# Patient Record
Sex: Female | Born: 1970 | Race: Black or African American | Hispanic: No | Marital: Single | Smoking: Former smoker
Health system: Southern US, Community
[De-identification: ages and names within clinical notes are randomized; demographics above are authoritative.]

## PROBLEM LIST (undated history)

## (undated) DIAGNOSIS — K279 Peptic ulcer, site unspecified, unspecified as acute or chronic, without hemorrhage or perforation: Secondary | ICD-10-CM

## (undated) DIAGNOSIS — J45909 Unspecified asthma, uncomplicated: Secondary | ICD-10-CM

## (undated) DIAGNOSIS — I10 Essential (primary) hypertension: Secondary | ICD-10-CM

## (undated) HISTORY — DX: Peptic ulcer, site unspecified, unspecified as acute or chronic, without hemorrhage or perforation: K27.9

---

## 2001-12-12 ENCOUNTER — Emergency Department (HOSPITAL_COMMUNITY): Admission: EM | Admit: 2001-12-12 | Discharge: 2001-12-12 | Payer: Self-pay | Admitting: *Deleted

## 2017-07-06 ENCOUNTER — Emergency Department (HOSPITAL_COMMUNITY)
Admission: EM | Admit: 2017-07-06 | Discharge: 2017-07-06 | Discharge status: Against Medical Advice | Payer: Self-pay | Attending: Emergency Medicine | Admitting: Emergency Medicine

## 2017-07-06 ENCOUNTER — Encounter (HOSPITAL_COMMUNITY): Payer: Self-pay

## 2017-07-06 DIAGNOSIS — R05 Cough: Secondary | ICD-10-CM | POA: Insufficient documentation

## 2017-07-06 DIAGNOSIS — Z5321 Procedure and treatment not carried out due to patient leaving prior to being seen by health care provider: Secondary | ICD-10-CM | POA: Insufficient documentation

## 2017-07-06 HISTORY — DX: Unspecified asthma, uncomplicated: J45.909

## 2017-07-06 LAB — RAPID STREP SCREEN (MED CTR MEBANE ONLY): Streptococcus, Group A Screen (Direct): NEGATIVE

## 2017-07-06 NOTE — ED Triage Notes (Signed)
Per Pt, Pt is coming from home with complaints of cough and congestion along with sore throat that started two days ago. Pt has hx of Asthma. Denies any chest pain.

## 2017-07-06 NOTE — ED Notes (Signed)
Patient stated that she had to leave in order to pick up her kids. EDP aware. Patient ambulatory with steady gait.

## 2017-07-06 NOTE — ED Provider Notes (Cosign Needed)
Patient was roomed but eloped prior to my evaluation. I did not evaluate the patient.   Dietrich PatesKhatri, Mahati Vajda, PA-C 07/06/17 1531

## 2017-07-08 LAB — CULTURE, GROUP A STREP (THRC)

## 2017-07-10 ENCOUNTER — Inpatient Hospital Stay (HOSPITAL_COMMUNITY)
Admission: EM | Admit: 2017-07-10 | Discharge: 2017-07-17 | DRG: 326 | Disposition: A | Discharge status: Home Health | Payer: BLUE CROSS/BLUE SHIELD | Attending: Surgery | Admitting: Surgery

## 2017-07-10 ENCOUNTER — Encounter (HOSPITAL_COMMUNITY): Payer: Self-pay | Admitting: Emergency Medicine

## 2017-07-10 ENCOUNTER — Encounter (HOSPITAL_COMMUNITY): Admission: EM | Disposition: A | Discharge status: Home Health | Payer: Self-pay | Source: Home / Self Care

## 2017-07-10 ENCOUNTER — Emergency Department (HOSPITAL_COMMUNITY): Payer: BLUE CROSS/BLUE SHIELD

## 2017-07-10 ENCOUNTER — Emergency Department (HOSPITAL_COMMUNITY): Payer: BLUE CROSS/BLUE SHIELD | Admitting: Anesthesiology

## 2017-07-10 ENCOUNTER — Other Ambulatory Visit: Payer: Self-pay

## 2017-07-10 DIAGNOSIS — T39395A Adverse effect of other nonsteroidal anti-inflammatory drugs [NSAID], initial encounter: Secondary | ICD-10-CM | POA: Diagnosis present

## 2017-07-10 DIAGNOSIS — R198 Other specified symptoms and signs involving the digestive system and abdomen: Secondary | ICD-10-CM

## 2017-07-10 DIAGNOSIS — J45909 Unspecified asthma, uncomplicated: Secondary | ICD-10-CM | POA: Diagnosis present

## 2017-07-10 DIAGNOSIS — K567 Ileus, unspecified: Secondary | ICD-10-CM

## 2017-07-10 DIAGNOSIS — K3184 Gastroparesis: Secondary | ICD-10-CM | POA: Diagnosis not present

## 2017-07-10 DIAGNOSIS — M549 Dorsalgia, unspecified: Secondary | ICD-10-CM | POA: Diagnosis present

## 2017-07-10 DIAGNOSIS — K659 Peritonitis, unspecified: Secondary | ICD-10-CM | POA: Diagnosis present

## 2017-07-10 DIAGNOSIS — K255 Chronic or unspecified gastric ulcer with perforation: Secondary | ICD-10-CM | POA: Diagnosis present

## 2017-07-10 DIAGNOSIS — F17210 Nicotine dependence, cigarettes, uncomplicated: Secondary | ICD-10-CM | POA: Diagnosis present

## 2017-07-10 DIAGNOSIS — K251 Acute gastric ulcer with perforation: Secondary | ICD-10-CM

## 2017-07-10 DIAGNOSIS — K9189 Other postprocedural complications and disorders of digestive system: Secondary | ICD-10-CM

## 2017-07-10 DIAGNOSIS — G8929 Other chronic pain: Secondary | ICD-10-CM | POA: Diagnosis present

## 2017-07-10 DIAGNOSIS — F1990 Other psychoactive substance use, unspecified, uncomplicated: Secondary | ICD-10-CM

## 2017-07-10 DIAGNOSIS — E876 Hypokalemia: Secondary | ICD-10-CM | POA: Diagnosis present

## 2017-07-10 HISTORY — PX: LAPAROTOMY: SHX154

## 2017-07-10 LAB — PREGNANCY, URINE: Preg Test, Ur: NEGATIVE

## 2017-07-10 LAB — I-STAT BETA HCG BLOOD, ED (MC, WL, AP ONLY): I-stat hCG, quantitative: 5.4 m[IU]/mL — ABNORMAL HIGH (ref <5)

## 2017-07-10 LAB — COMPREHENSIVE METABOLIC PANEL
ALBUMIN: 4.1 g/dL (ref 3.5–5.0)
ALT: 11 U/L — ABNORMAL LOW (ref 14–54)
ANION GAP: 10 (ref 5–15)
AST: 17 U/L (ref 15–41)
Alkaline Phosphatase: 56 U/L (ref 38–126)
BUN: 9 mg/dL (ref 6–20)
CO2: 19 mmol/L — AB (ref 22–32)
Calcium: 9.6 mg/dL (ref 8.9–10.3)
Chloride: 106 mmol/L (ref 101–111)
Creatinine, Ser: 0.72 mg/dL (ref 0.44–1.00)
GFR calc Af Amer: >60 mL/min (ref >60)
GFR calc non Af Amer: >60 mL/min (ref >60)
GLUCOSE: 107 mg/dL — AB (ref 65–99)
POTASSIUM: 3.1 mmol/L — AB (ref 3.5–5.1)
SODIUM: 135 mmol/L (ref 135–145)
Total Bilirubin: 0.4 mg/dL (ref 0.3–1.2)
Total Protein: 7.2 g/dL (ref 6.5–8.1)

## 2017-07-10 LAB — CBC WITH DIFFERENTIAL/PLATELET
Basophils Absolute: 0 10*3/uL (ref 0.0–0.1)
Basophils Relative: 0 %
EOS ABS: 0 10*3/uL (ref 0.0–0.7)
EOS PCT: 0 %
HCT: 44.7 % (ref 36.0–46.0)
HEMOGLOBIN: 14.9 g/dL (ref 12.0–15.0)
Lymphocytes Relative: 11 %
Lymphs Abs: 0.4 10*3/uL — ABNORMAL LOW (ref 0.7–4.0)
MCH: 27.6 pg (ref 26.0–34.0)
MCHC: 33.3 g/dL (ref 30.0–36.0)
MCV: 82.9 fL (ref 78.0–100.0)
MONO ABS: 0.3 10*3/uL (ref 0.1–1.0)
MONOS PCT: 6 %
Neutro Abs: 3.2 10*3/uL (ref 1.7–7.7)
Neutrophils Relative %: 83 %
PLATELETS: 256 10*3/uL (ref 150–400)
RBC: 5.39 MIL/uL — ABNORMAL HIGH (ref 3.87–5.11)
RDW: 16.8 % — AB (ref 11.5–15.5)
WBC: 3.9 10*3/uL — ABNORMAL LOW (ref 4.0–10.5)

## 2017-07-10 LAB — CBG MONITORING, ED: GLUCOSE-CAPILLARY: 99 mg/dL (ref 65–99)

## 2017-07-10 LAB — RAPID URINE DRUG SCREEN, HOSP PERFORMED
AMPHETAMINES: NOT DETECTED
BENZODIAZEPINES: NOT DETECTED
Barbiturates: POSITIVE — AB
Cocaine: NOT DETECTED
OPIATES: NOT DETECTED
TETRAHYDROCANNABINOL: POSITIVE — AB

## 2017-07-10 LAB — SALICYLATE LEVEL: Salicylate Lvl: <7 mg/dL (ref 2.8–30.0)

## 2017-07-10 LAB — ACETAMINOPHEN LEVEL: Acetaminophen (Tylenol), Serum: <10 ug/mL — ABNORMAL LOW (ref 10–30)

## 2017-07-10 LAB — ETHANOL

## 2017-07-10 SURGERY — LAPAROTOMY, EXPLORATORY
Anesthesia: General | Site: Abdomen

## 2017-07-10 MED ORDER — PROPOFOL 10 MG/ML IV BOLUS
INTRAVENOUS | Status: DC | PRN
  Administered 2017-07-10: 160 mg via INTRAVENOUS

## 2017-07-10 MED ORDER — DIPHENHYDRAMINE HCL 50 MG/ML IJ SOLN: 12.5000 mg | Freq: Four times a day (QID) | INTRAMUSCULAR | Status: DC | PRN

## 2017-07-10 MED ORDER — SUCCINYLCHOLINE CHLORIDE 200 MG/10ML IV SOSY
PREFILLED_SYRINGE | INTRAVENOUS | Status: AC
  Filled 2017-07-10: qty 10

## 2017-07-10 MED ORDER — SODIUM CHLORIDE 0.9% FLUSH: 9.0000 mL | INTRAVENOUS | Status: DC | PRN

## 2017-07-10 MED ORDER — ACETAMINOPHEN 650 MG RE SUPP: 650.0000 mg | Freq: Four times a day (QID) | RECTAL | Status: DC | PRN

## 2017-07-10 MED ORDER — ACETAMINOPHEN 325 MG PO TABS: 650.0000 mg | ORAL_TABLET | Freq: Four times a day (QID) | ORAL | Status: DC | PRN

## 2017-07-10 MED ORDER — DIPHENHYDRAMINE HCL 25 MG PO CAPS: 25.0000 mg | ORAL_CAPSULE | Freq: Four times a day (QID) | ORAL | Status: DC | PRN

## 2017-07-10 MED ORDER — PIPERACILLIN-TAZOBACTAM 3.375 G IVPB 30 MIN
3.3750 g | Freq: Once | INTRAVENOUS | Status: AC
  Administered 2017-07-10: 3.375 g via INTRAVENOUS
  Filled 2017-07-10: qty 50

## 2017-07-10 MED ORDER — ZOLPIDEM TARTRATE 5 MG PO TABS
5.0000 mg | ORAL_TABLET | Freq: Every evening | ORAL | Status: DC | PRN
Start: 2017-07-10 — End: 2017-07-17

## 2017-07-10 MED ORDER — MORPHINE SULFATE (PF) 4 MG/ML IV SOLN
4.0000 mg | Freq: Once | INTRAVENOUS | Status: AC
  Administered 2017-07-10: 4 mg via INTRAVENOUS
  Filled 2017-07-10: qty 1

## 2017-07-10 MED ORDER — DIPHENHYDRAMINE HCL 12.5 MG/5ML PO ELIX: 12.5000 mg | ORAL_SOLUTION | Freq: Four times a day (QID) | ORAL | Status: DC | PRN

## 2017-07-10 MED ORDER — SUGAMMADEX SODIUM 500 MG/5ML IV SOLN
INTRAVENOUS | Status: DC | PRN
  Administered 2017-07-10: 200 mg via INTRAVENOUS

## 2017-07-10 MED ORDER — ROCURONIUM BROMIDE 10 MG/ML (PF) SYRINGE
PREFILLED_SYRINGE | INTRAVENOUS | Status: DC | PRN
  Administered 2017-07-10: 30 mg via INTRAVENOUS
  Administered 2017-07-10: 10 mg via INTRAVENOUS

## 2017-07-10 MED ORDER — ENOXAPARIN SODIUM 40 MG/0.4ML ~~LOC~~ SOLN
40.0000 mg | SUBCUTANEOUS | Status: DC
  Administered 2017-07-11 – 2017-07-17 (×7): 40 mg via SUBCUTANEOUS
  Filled 2017-07-10 (×7): qty 0.4

## 2017-07-10 MED ORDER — POTASSIUM CHLORIDE IN NACL 20-0.9 MEQ/L-% IV SOLN
INTRAVENOUS | Status: DC
  Administered 2017-07-10 – 2017-07-11 (×2): via INTRAVENOUS
  Filled 2017-07-10 (×3): qty 1000

## 2017-07-10 MED ORDER — FENTANYL CITRATE (PF) 250 MCG/5ML IJ SOLN
INTRAMUSCULAR | Status: AC
  Filled 2017-07-10: qty 5

## 2017-07-10 MED ORDER — LIDOCAINE 2% (20 MG/ML) 5 ML SYRINGE
INTRAMUSCULAR | Status: AC
  Filled 2017-07-10: qty 5

## 2017-07-10 MED ORDER — ROCURONIUM BROMIDE 10 MG/ML (PF) SYRINGE
PREFILLED_SYRINGE | INTRAVENOUS | Status: AC
  Filled 2017-07-10: qty 5

## 2017-07-10 MED ORDER — OXYCODONE HCL 5 MG/5ML PO SOLN: 5.0000 mg | Freq: Once | ORAL | Status: DC | PRN

## 2017-07-10 MED ORDER — SODIUM CHLORIDE 0.9 % IV BOLUS (SEPSIS)
1000.0000 mL | Freq: Once | INTRAVENOUS | Status: AC
  Administered 2017-07-10: 1000 mL via INTRAVENOUS

## 2017-07-10 MED ORDER — MIDAZOLAM HCL 5 MG/5ML IJ SOLN
INTRAMUSCULAR | Status: DC | PRN
  Administered 2017-07-10: 2 mg via INTRAVENOUS

## 2017-07-10 MED ORDER — NALOXONE HCL 0.4 MG/ML IJ SOLN: 0.4000 mg | INTRAMUSCULAR | Status: DC | PRN

## 2017-07-10 MED ORDER — DEXAMETHASONE SODIUM PHOSPHATE 10 MG/ML IJ SOLN
INTRAMUSCULAR | Status: AC
  Filled 2017-07-10: qty 1

## 2017-07-10 MED ORDER — OXYCODONE HCL 5 MG PO TABS: 5.0000 mg | ORAL_TABLET | Freq: Once | ORAL | Status: DC | PRN

## 2017-07-10 MED ORDER — ONDANSETRON HCL 4 MG/2ML IJ SOLN
INTRAMUSCULAR | Status: DC | PRN
  Administered 2017-07-10: 4 mg via INTRAVENOUS

## 2017-07-10 MED ORDER — IOPAMIDOL (ISOVUE-300) INJECTION 61%
INTRAVENOUS | Status: AC
  Filled 2017-07-10: qty 100

## 2017-07-10 MED ORDER — PANTOPRAZOLE SODIUM 40 MG IV SOLR
40.0000 mg | Freq: Every day | INTRAVENOUS | Status: DC
  Administered 2017-07-10: 40 mg via INTRAVENOUS
  Filled 2017-07-10: qty 40

## 2017-07-10 MED ORDER — MIDAZOLAM HCL 2 MG/2ML IJ SOLN
INTRAMUSCULAR | Status: AC
  Filled 2017-07-10: qty 2

## 2017-07-10 MED ORDER — ONDANSETRON HCL 4 MG/2ML IJ SOLN
4.0000 mg | Freq: Four times a day (QID) | INTRAMUSCULAR | Status: DC | PRN
  Administered 2017-07-13: 4 mg via INTRAVENOUS
  Filled 2017-07-10: qty 2

## 2017-07-10 MED ORDER — PIPERACILLIN-TAZOBACTAM 3.375 G IVPB
3.3750 g | Freq: Three times a day (TID) | INTRAVENOUS | Status: AC
  Administered 2017-07-10 – 2017-07-15 (×15): 3.375 g via INTRAVENOUS
  Filled 2017-07-10 (×13): qty 50

## 2017-07-10 MED ORDER — HYDROMORPHONE HCL 1 MG/ML IJ SOLN: 0.2500 mg | INTRAMUSCULAR | Status: DC | PRN

## 2017-07-10 MED ORDER — LACTATED RINGERS IV SOLN
INTRAVENOUS | Status: DC | PRN
  Administered 2017-07-10 (×2): via INTRAVENOUS

## 2017-07-10 MED ORDER — HYDRALAZINE HCL 20 MG/ML IJ SOLN: 10.0000 mg | INTRAMUSCULAR | Status: DC | PRN

## 2017-07-10 MED ORDER — IOPAMIDOL (ISOVUE-300) INJECTION 61%
100.0000 mL | Freq: Once | INTRAVENOUS | Status: AC | PRN
  Administered 2017-07-10: 100 mL via INTRAVENOUS

## 2017-07-10 MED ORDER — MORPHINE SULFATE 2 MG/ML IV SOLN
INTRAVENOUS | Status: DC
  Administered 2017-07-10: 4.5 mg via INTRAVENOUS
  Administered 2017-07-10: 7.5 mg via INTRAVENOUS
  Administered 2017-07-10: 3 mg via INTRAVENOUS
  Administered 2017-07-10: 14:00:00 via INTRAVENOUS
  Administered 2017-07-11: 1.5 mg via INTRAVENOUS
  Administered 2017-07-11 (×2): 3 mg via INTRAVENOUS
  Administered 2017-07-11: 9 mg via INTRAVENOUS
  Administered 2017-07-11: 4.5 mg via INTRAVENOUS
  Administered 2017-07-11: 3 mg via INTRAVENOUS
  Administered 2017-07-12: 06:00:00 via INTRAVENOUS
  Administered 2017-07-12: 9 mg via INTRAVENOUS
  Administered 2017-07-12: 3 mg via INTRAVENOUS
  Administered 2017-07-12: 12 mg via INTRAVENOUS
  Administered 2017-07-12: 11.3 mg via INTRAVENOUS
  Filled 2017-07-10 (×2): qty 30

## 2017-07-10 MED ORDER — PROPOFOL 10 MG/ML IV BOLUS
INTRAVENOUS | Status: AC
  Filled 2017-07-10: qty 20

## 2017-07-10 MED ORDER — SUCCINYLCHOLINE CHLORIDE 200 MG/10ML IV SOSY
PREFILLED_SYRINGE | INTRAVENOUS | Status: DC | PRN
  Administered 2017-07-10: 120 mg via INTRAVENOUS

## 2017-07-10 MED ORDER — ONDANSETRON 4 MG PO TBDP: 4.0000 mg | ORAL_TABLET | Freq: Four times a day (QID) | ORAL | Status: DC | PRN

## 2017-07-10 MED ORDER — ONDANSETRON HCL 4 MG/2ML IJ SOLN
INTRAMUSCULAR | Status: AC
  Filled 2017-07-10: qty 2

## 2017-07-10 MED ORDER — DEXAMETHASONE SODIUM PHOSPHATE 10 MG/ML IJ SOLN
INTRAMUSCULAR | Status: DC | PRN
  Administered 2017-07-10: 10 mg via INTRAVENOUS

## 2017-07-10 MED ORDER — ONDANSETRON HCL 4 MG/2ML IJ SOLN
4.0000 mg | Freq: Four times a day (QID) | INTRAMUSCULAR | Status: DC | PRN
Start: 2017-07-10 — End: 2017-07-10

## 2017-07-10 MED ORDER — DIPHENHYDRAMINE HCL 50 MG/ML IJ SOLN: 25.0000 mg | Freq: Four times a day (QID) | INTRAMUSCULAR | Status: DC | PRN

## 2017-07-10 MED ORDER — ORAL CARE MOUTH RINSE
15.0000 mL | Freq: Two times a day (BID) | OROMUCOSAL | Status: DC
  Administered 2017-07-10 – 2017-07-16 (×11): 15 mL via OROMUCOSAL

## 2017-07-10 MED ORDER — FENTANYL CITRATE (PF) 100 MCG/2ML IJ SOLN
INTRAMUSCULAR | Status: DC | PRN
  Administered 2017-07-10: 50 ug via INTRAVENOUS
  Administered 2017-07-10: 100 ug via INTRAVENOUS
  Administered 2017-07-10 (×2): 50 ug via INTRAVENOUS

## 2017-07-10 MED ORDER — 0.9 % SODIUM CHLORIDE (POUR BTL) OPTIME
TOPICAL | Status: DC | PRN
  Administered 2017-07-10: 6000 mL

## 2017-07-10 SURGICAL SUPPLY — 35 items
APPLICATOR COTTON TIP 6IN STRL (MISCELLANEOUS) ×1 IMPLANT
BLADE EXTENDED COATED 6.5IN (ELECTRODE) IMPLANT
BLADE HEX COATED 2.75 (ELECTRODE) ×3 IMPLANT
COVER MAYO STAND STRL (DRAPES) IMPLANT
COVER SURGICAL LIGHT HANDLE (MISCELLANEOUS) ×3 IMPLANT
DRAPE LAPAROSCOPIC ABDOMINAL (DRAPES) ×3 IMPLANT
DRAPE UTILITY XL STRL (DRAPES) IMPLANT
DRAPE WARM FLUID 44X44 (DRAPE) IMPLANT
DRSG VAC ATS SM SENSATRAC (GAUZE/BANDAGES/DRESSINGS) ×2 IMPLANT
ELECT REM PT RETURN 15FT ADLT (MISCELLANEOUS) ×3 IMPLANT
GAUZE SPONGE 4X4 12PLY STRL (GAUZE/BANDAGES/DRESSINGS) ×1 IMPLANT
GLOVE BIO SURGEON STRL SZ7 (GLOVE) ×3 IMPLANT
GLOVE BIOGEL PI IND STRL 7.0 (GLOVE) ×1 IMPLANT
GLOVE BIOGEL PI IND STRL 7.5 (GLOVE) ×1 IMPLANT
GLOVE BIOGEL PI INDICATOR 7.0 (GLOVE) ×2
GLOVE BIOGEL PI INDICATOR 7.5 (GLOVE) ×2
GOWN STRL REUS W/TWL LRG LVL3 (GOWN DISPOSABLE) ×6 IMPLANT
GOWN STRL REUS W/TWL XL LVL3 (GOWN DISPOSABLE) ×3 IMPLANT
HANDLE SUCTION POOLE (INSTRUMENTS) IMPLANT
KIT BASIN OR (CUSTOM PROCEDURE TRAY) ×3 IMPLANT
NS IRRIG 1000ML POUR BTL (IV SOLUTION) IMPLANT
PACK GENERAL/GYN (CUSTOM PROCEDURE TRAY) ×3 IMPLANT
SPONGE LAP 18X18 X RAY DECT (DISPOSABLE) IMPLANT
STAPLER VISISTAT 35W (STAPLE) IMPLANT
SUCTION POOLE HANDLE (INSTRUMENTS)
SUT PDS AB 1 CTX 36 (SUTURE) IMPLANT
SUT SILK 2 0 SH CR/8 (SUTURE) IMPLANT
SUT SILK 3 0 (SUTURE)
SUT SILK 3 0 SH CR/8 (SUTURE) ×2 IMPLANT
SUT SILK 3-0 18XBRD TIE 12 (SUTURE) IMPLANT
TOWEL OR 17X26 10 PK STRL BLUE (TOWEL DISPOSABLE) ×4 IMPLANT
TRAY FOLEY W/METER SILVER 14FR (SET/KITS/TRAYS/PACK) ×2 IMPLANT
TRAY FOLEY W/METER SILVER 16FR (SET/KITS/TRAYS/PACK) IMPLANT
WATER STERILE IRR 1000ML POUR (IV SOLUTION) ×12 IMPLANT
YANKAUER SUCT BULB TIP NO VENT (SUCTIONS) IMPLANT

## 2017-07-10 NOTE — ED Notes (Signed)
Transported to radiology 

## 2017-07-10 NOTE — Progress Notes (Signed)
Pharmacy Antibiotic Note  Sherri Gilbert is a 47 y.o. female admitted on 07/10/2017 with peritonitis.  Pharmacy has been consulted for Zosyn dosing.  Plan:  Zosyn 3.375 g IV given once over 30 minutes, then every 8 hrs by 4-hr infusion  Pharmacy will sign off but will follow peripherally for cultures or renal adjustments  Height: 5\' 7"  (170.2 cm) Weight: 130 lb (59 kg) IBW/kg (Calculated) : 61.6  Temp (24hrs), Avg:98.8 F (37.1 C), Min:98.6 F (37 C), Max:99.3 F (37.4 C)  Recent Labs  Lab 07/10/17 0738  WBC 3.9*  CREATININE 0.72    Estimated Creatinine Clearance: 80.1 mL/min (by C-G formula based on SCr of 0.72 mg/dL).    No Known Allergies   Thank you for allowing pharmacy to be a part of this patient's care.  Bernadene Personrew Kamorah Nevils, PharmD, BCPS [phone] 07/10/2017, 3:46 PM

## 2017-07-10 NOTE — Progress Notes (Signed)
A consult was received from an ED physician for Zosyn per pharmacy dosing.  The patient's profile has been reviewed for ht/wt/allergies/indication/available labs.   A one time order has been placed for Zosyn 3.375g.    Further antibiotics/pharmacy consults should be ordered by admitting physician if indicated.                       Thank you, Loralee PacasErin Jaia Alonge, PharmD, BCPS 07/10/2017  10:17 AM

## 2017-07-10 NOTE — ED Provider Notes (Signed)
[facility]-EMERGENCY DEPT Provider Note  CSN: [id number] Arrival date & time: 07/10/17 4098  Chief Complaint(s) Abdominal Pain (RLQ )  HPI Sherri Gilbert is a 47 y.o. female who presents with acute onset of abdominal pain that began approximately 8-10 hours prior to arrival.  Pain is epigastric, right upper quadrant, and right lower quadrant.  Constant since onset.  Stabbing in nature.  Pain is severe.  Exacerbated with movement and palpation.  No alleviating factors.  She denies any fevers or chills.  Denies any nausea or vomiting.  Denies any chest pain or current shortness of breath.   Additionally patient reports that for the past several days she has not been feeling well.  She states that she has been having some made back pain.  She presented for this on January 29 but left prior to being seen.  She reports that after that she went to the pharmacy and got over-the-counter Motrin.  She is taken approximately 20 capsules of 200mg  ibuprofen in the last 36 hrs.  Denies any illicit drug use.  No daily alcohol consumption.  Denies any past medical history or prior surgeries.   The history is provided by the patient.        Past Medical History Past Medical History:  Diagnosis Date  . Asthma    There are no active problems to display for this patient.  Home Medication(s) Prior to Admission medications   Medication Sig Start Date End Date Taking? Authorizing Provider  ibuprofen (ADVIL,MOTRIN) 200 MG tablet Take 400 mg by mouth every 6 (six) hours as needed for mild pain.   Yes [provider]                                                                                                                                    Past Surgical History History reviewed. No pertinent surgical history. Family History No family history on file.  Social History Social History   Tobacco Use  . Smoking status: Current Every Day Smoker    Packs/day: 0.50   Types: Cigarettes  . Smokeless tobacco: Never Used  Substance Use Topics  . Alcohol use: No    Frequency: Never  . Drug use: No   Allergies Patient has no known allergies.  Review of Systems Review of Systems All other systems are reviewed and are negative for acute change except as noted in the HPI  Physical Exam Vital Signs  I have reviewed the triage vital signs BP (!) 145/92 (BP Location: Left Arm)   Pulse (!) 115   Temp 99.3 F (37.4 C) (Oral)   Resp 20   LMP 06/12/2017 Comment: negative urine pregnancy test result 07-10-17  SpO2 97%   Physical Exam  Constitutional: She is oriented to person, place, and time. She appears well-developed and well-nourished. No distress.  HENT:  Head: Normocephalic and atraumatic.  Nose: Nose normal.  Eyes: Conjunctivae and EOM  are normal. Pupils are equal, round, and reactive to light. Right eye exhibits no discharge. Left eye exhibits no discharge. No scleral icterus.  Neck: Normal range of motion. Neck supple.  Cardiovascular: Normal rate and regular rhythm. Exam reveals no gallop and no friction rub.  No murmur heard. Pulmonary/Chest: Effort normal and breath sounds normal. No stridor. No respiratory distress. She has no rales.  Abdominal: Soft. She exhibits no distension. There is tenderness in the right upper quadrant, right lower quadrant and epigastric area. There is rebound and positive Murphy's sign. There is no rigidity and no guarding.  Musculoskeletal: She exhibits no edema or tenderness.  Neurological: She is alert and oriented to person, place, and time.  Skin: Skin is warm and dry. No rash noted. She is not diaphoretic. No erythema.  Psychiatric: She has a normal mood and affect.  Vitals reviewed.   ED Results and Treatments Labs (all labs ordered are listed, but only abnormal results are displayed) Labs Reviewed  COMPREHENSIVE METABOLIC PANEL - Abnormal; Notable for the following components:      Result Value    Potassium 3.1 (*)    CO2 19 (*)    Glucose, Bld 107 (*)    ALT 11 (*)    All other components within normal limits  ACETAMINOPHEN LEVEL - Abnormal; Notable for the following components:   Acetaminophen (Tylenol), Serum <10 (*)    All other components within normal limits  RAPID URINE DRUG SCREEN, HOSP PERFORMED - Abnormal; Notable for the following components:   Tetrahydrocannabinol POSITIVE (*)    Barbiturates POSITIVE (*)    All other components within normal limits  CBC WITH DIFFERENTIAL/PLATELET - Abnormal; Notable for the following components:   WBC 3.9 (*)    RBC 5.39 (*)    RDW 16.8 (*)    Lymphs Abs 0.4 (*)    All other components within normal limits  I-STAT BETA HCG BLOOD, ED ([facility], [facility], [facility] ONLY) - Abnormal; Notable for the following components:   I-stat hCG, quantitative 5.4 (*)    All other components within normal limits  SALICYLATE LEVEL  ETHANOL  PREGNANCY, URINE  CBG MONITORING, ED                                                                                                                         EKG  EKG Interpretation  Date/Time:  Saturday July 10 2017 07:02:51 EST Ventricular Rate:  117 PR Interval:    QRS Duration: 83 QT Interval:  312 QTC Calculation: 436 R Axis:   96 Text Interpretation:  Sinus tachycardia Borderline right axis deviation Consider left ventricular hypertrophy Nonspecific T abnormalities, inferior leads Confirmed by Nicanor Alcon, April ([id number]) on 07/10/2017 7:14:47 AM Also confirmed by Nicanor Alcon, April ([id number]), editor Barbette Hair [phone])  on 07/10/2017 8:28:02 AM      Radiology Ct Abdomen Pelvis W Contrast  Result Date: 07/10/2017 CLINICAL DATA:  Right lower abdominal pain x1 day EXAM: CT ABDOMEN AND PELVIS WITH CONTRAST  TECHNIQUE: Multidetector CT imaging of the abdomen and pelvis was performed using the standard protocol following bolus administration of intravenous contrast. CONTRAST:  100mL ISOVUE-300 IOPAMIDOL (ISOVUE-300) INJECTION 61%  COMPARISON:  None. FINDINGS: Lower chest: Lung bases are clear. Hepatobiliary: 12 mm cyst in the central right liver. Gallbladder is unremarkable. No intrahepatic or extrahepatic ductal dilatation. Pancreas: Within normal limits. Spleen: Within normal limits. Adrenals/Urinary Tract: Adrenal glands are within normal limits. Kidneys are within normal limits.  No hydronephrosis. Bladder is within normal limits. Stomach/Bowel: Mild submucosal edema in the region of the distal gastric antrum and proximal duodenum, best visualized on sagittal imaging (sagittal images 61 and 53). Given the additional findings below, this appearance is worrisome for possible gastroduodenal ulcer. No evidence of bowel obstruction. Normal appendix (series 2/image 41). Vascular/Lymphatic: No evidence of abdominal aortic aneurysm. No suspicious abdominopelvic lymphadenopathy. Reproductive: Enlarged uterus with dominant 9.9 x 7.4 x 9.4 cm intramural posterior uterine body mass (sagittal image 56), likely reflecting a very large uterine fibroid. Left ovary is within normal limits (series 2/image 54). Dilated tubular structure posterior the right ovary (series 2/image 62), favoring hydrosalpinx. Given the posterior location in the pelvis, this would be difficult to assess sonographically. Other: Moderate abdominopelvic ascites. Moderate pneumoperitoneum, predominantly along the anterior abdomen. Musculoskeletal: Mild degenerative changes at L3-4. IMPRESSION: Moderate pneumoperitoneum with associated abdominopelvic ascites, indicating a perforated hollow viscus. Given mild submucosal edema in the region of the distal gastric antrum and proximal duodenum, this raises a perforated gastroduodenal ulcer as the most likely etiology. 9.9 cm posterior uterine body mass, favoring a large uterine fibroid. Additional ancillary findings as above. Critical Value/emergent results were called by telephone at the time of interpretation on 07/10/2017 at 10:03 am to  Dr. Drema PryPEDRO Adalind Weitz , who verbally acknowledged these results. Electronically Signed   By: Charline BillsSriyesh  Krishnan M.D.   On: 07/10/2017 10:06   Pertinent labs & imaging results that were available during my care of the patient were reviewed by me and considered in my medical decision making (see chart for details).  Medications Ordered in ED Medications  iopamidol (ISOVUE-300) 61 % injection (not administered)  sodium chloride 0.9 % bolus 1,000 mL (not administered)  piperacillin-tazobactam (ZOSYN) IVPB 3.375 g (not administered)  morphine 4 MG/ML injection 4 mg (4 mg Intravenous Given 07/10/17 0909)  iopamidol (ISOVUE-300) 61 % injection 100 mL (100 mLs Intravenous Contrast Given 07/10/17 [id number])                                                                                                                                    Procedures Procedures CRITICAL CARE Performed by: Amadeo GarnetPedro Eduardo Delan Ksiazek Total critical care time: 40 minutes Critical care time was exclusive of separately billable procedures and treating other patients. Critical care was necessary to treat or prevent imminent or life-threatening deterioration. Critical care was time spent personally by me on the following activities: development of treatment plan with patient  and/or surrogate as well as nursing, discussions with consultants, evaluation of patient's response to treatment, examination of patient, obtaining history from patient or surrogate, ordering and performing treatments and interventions, ordering and review of laboratory studies, ordering and review of radiographic studies, pulse oximetry and re-evaluation of patient's condition.   (including critical care time)  Medical Decision Making / ED Course I have reviewed the nursing notes for this encounter and the patient's prior records (if available in EHR or on provided paperwork).     1. abd pain Epigastric, right upper quadrant, right lower quadrant abdominal tenderness  with positive Murphy sign.  No prior abdominal surgeries.  Given her recent ingestion of large dose of NSAIDs, I have suspicion for peptic ulcer disease.  Other possibilities would be pancreatitis, acute cholecystitis.  Screening labs obtain and are grossly reassuring without leukocytosis, severe electrolyte derangements or renal insufficiency.  CT scan was obtained which revealed pneumoperitoneum likely from gastric or duodenal source consistent with a likely perforated peptic ulcer.  Patient started on empiric antibiotics.  Surgery was consulted who evaluated the patient in the emergency department and will take patient to the OR for ex lap.   2. NSAID overuse 100mg /kg. Unlikely to have adverse effect. Will monitor for the duration of her work up.   Final Clinical Impression(s) / ED Diagnoses Final diagnoses:  Perforated viscus  Excessive use of nonsteroidal anti-inflammatory drug (NSAID)      This chart was dictated using voice recognition software.  Despite best efforts to proofread,  errors can occur which can change the documentation meaning.   Nira Conn, MD 07/10/17 1032

## 2017-07-10 NOTE — Anesthesia Postprocedure Evaluation (Signed)
Anesthesia Post Note  Patient: Sherri Gilbert  Procedure(s) Performed: EXPLORATORY LAPAROTOMY REPAIR OF PERFORATED ULCER AT PYLORUS (N/A Abdomen)     Patient location during evaluation: PACU Anesthesia Type: General Level of consciousness: awake and alert Pain management: pain level controlled Vital Signs Assessment: post-procedure vital signs reviewed and stable Respiratory status: spontaneous breathing, nonlabored ventilation, respiratory function stable and patient connected to nasal cannula oxygen Cardiovascular status: blood pressure returned to baseline and stable Postop Assessment: no apparent nausea or vomiting Anesthetic complications: no    Last Vitals:  Vitals:   07/10/17 1445 07/10/17 1500  BP: 140/86 138/83  Pulse: (!) 102 98  Resp: (!) 23 (!) 28  Temp:  37.1 C  SpO2: 100% 100%    Last Pain:  Vitals:   07/10/17 1500  TempSrc:   PainSc: Asleep                 Maurizio Geno

## 2017-07-10 NOTE — ED Notes (Signed)
Pt refuses all blood work and IV stick. Willing to give urine, pt verbalizes all she wants is a chest xray and a antibiotic.

## 2017-07-10 NOTE — Op Note (Signed)
Pre-op diagnosis:  Perforated viscus secondary to heavy NSAID use Post-op diagnosis:  Perforated prepyloric ulcer Procedure performed: Exploratory laparotomy with Cheree DittoGraham patch of perforated prepyloric ulcer/placement of subcutaneous wound VAC (2 x 15 cm) Surgeon:Kaycee Mcgaugh K Haisley Arens Anesthesia: General endotracheal Indications: This is a 47 year old female who presents after heavy usage of ibuprofen over the last day and a half for back pain.  She experienced sudden onset of abdominal pain and was brought to the emergency department for evaluation.  She was found to have a large amount of free fluid in her abdomen as well as evidence of free air consistent with perforated viscus.  We were consulted and brought her directly to the operating room for exploratory laparotomy.  Description of procedure: The patient is brought to the operating room placed in supine position on the operating room table.  After an adequate level of general anesthesia was obtained a Foley catheter was placed under sterile technique.  The patient's abdomen was prepped with ChloraPrep and draped in sterile fashion.  A timeout was taken to ensure the proper patient and proper procedure.  We made a vertical midline incision between the xiphoid and the umbilicus.  Dissection was carried down to the linea alba with cautery.  We divided the linea alba vertically.  We entered the peritoneal cavity sharply.  We encountered a large amount of bile colored fluid.  We suctioned out all of the visible bile colored fluid.  We then placed a Balfour retractor for exposure.  We were able to easily identify an 8 mm diameter full-thickness perforation just proximal to the pylorus.  Bile was leaking out of this hole.  The edges of the ulcer are mildly thickened but the remainder of the stomach appears normal.  The omentum nearby also appeared to be normal in texture.  We repaired the ulcer by closing with full-thickness 3-0 silk sutures.  The tails of the  sutures were then used to hold a patch of omentum over this repair.  There is no sign of further leaking.  The nasogastric tube was palpated in a position just proximal to the ulcer.  We then irrigated the abdomen thoroughly with 6 L of warm saline.  The irrigation E fluent was clear.  We then closed the fascia with double-stranded #1 PDS suture.  We placed a small VAC sponge cut to fit the midline wound and sealed this with an occlusive drape.  This was placed to suction.  The patient was then extubated and brought to the recovery room in stable condition.  All sponge, instrument, and needle counts are correct.  Wilmon ArmsMatthew K. Corliss Skainssuei, MD, [facility]  General/ Trauma Surgery  07/10/2017 1:11 PM

## 2017-07-10 NOTE — ED Notes (Signed)
She is aware of need for impending surgery; and reasons why as discussed with Dr. Eudelia Bunchardama. Her surgeon is currently performing a case in our O.R. And will perform hers afterward (~ 1 hour). She is notifying her family with her cell phone. She remains in no distress.

## 2017-07-10 NOTE — H&P (Signed)
Sherri Gilbert is an 47 y.o. female.   Chief Complaint: severe abdominal pain HPI: This is a 47 year old female in good health who presents with a couple of days of "not feeling well".  Patient had some respiratory symptoms and poor appetite.  She also had some back pain.  Reportedly she took over 20 capsules of 200 mg ibuprofen within the last 24-36 hours before her back pain.  She also has not had much food to eat during this time.  She had acute onset of generalized abdominal pain beginning yesterday evening.  She was brought in by EMS and is found to have what appears to be a perforated viscus.  Past Medical History:  Diagnosis Date  . Asthma     History reviewed. No pertinent surgical history.  No family history on file. Social History:  reports that she has been smoking cigarettes.  She has been smoking about 0.50 packs per day. she has never used smokeless tobacco. She reports that she does not drink alcohol or use drugs.  Allergies: No Known Allergies  Prior to Admission medications   Medication Sig Start Date End Date Taking? Authorizing Provider  ibuprofen (ADVIL,MOTRIN) 200 MG tablet Take 400 mg by mouth every 6 (six) hours as needed for mild pain.   Yes [provider]     Results for orders placed or performed during the hospital encounter of 07/10/17 (from the past 48 hour(s))  Urine rapid drug screen (hosp performed)     Status: Abnormal   Collection Time: 07/10/17  5:43 AM  Result Value Ref Range   Opiates NONE DETECTED NONE DETECTED   Cocaine NONE DETECTED NONE DETECTED   Benzodiazepines NONE DETECTED NONE DETECTED   Amphetamines NONE DETECTED NONE DETECTED   Tetrahydrocannabinol POSITIVE (A) NONE DETECTED   Barbiturates POSITIVE (A) NONE DETECTED    Comment: (NOTE) DRUG SCREEN FOR MEDICAL PURPOSES ONLY.  IF CONFIRMATION IS NEEDED FOR ANY PURPOSE, NOTIFY LAB WITHIN 5 DAYS. LOWEST DETECTABLE LIMITS FOR URINE DRUG SCREEN Drug Class                      Cutoff (ng/mL) Amphetamine and metabolites    1000 Barbiturate and metabolites    200 Benzodiazepine                 161 Tricyclics and metabolites     300 Opiates and metabolites        300 Cocaine and metabolites        300 THC                            50 Performed at [facility], [place] [address] [place], [place] [address]   Pregnancy, urine     Status: None   Collection Time: 07/10/17  5:43 AM  Result Value Ref Range   Preg Test, Ur NEGATIVE NEGATIVE    Comment:        THE SENSITIVITY OF THIS METHODOLOGY IS >20 mIU/mL. Performed at [facility], [place] [address] [place], [place] [address]   CBG monitoring, ED     Status: None   Collection Time: 07/10/17  7:16 AM  Result Value Ref Range   Glucose-Capillary 99 65 - 99 mg/dL   Comment 1 Notify RN   Comprehensive metabolic panel     Status: Abnormal   Collection Time: 07/10/17  7:38 AM  Result Value Ref Range   Sodium  135 135 - 145 mmol/L   Potassium 3.1 (L) 3.5 - 5.1 mmol/L   Chloride 106 101 - 111 mmol/L   CO2 19 (L) 22 - 32 mmol/L   Glucose, Bld 107 (H) 65 - 99 mg/dL   BUN 9 6 - 20 mg/dL   Creatinine, Ser 0.72 0.44 - 1.00 mg/dL   Calcium 9.6 8.9 - 10.3 mg/dL   Total Protein 7.2 6.5 - 8.1 g/dL   Albumin 4.1 3.5 - 5.0 g/dL   AST 17 15 - 41 U/L   ALT 11 (L) 14 - 54 U/L   Alkaline Phosphatase 56 38 - 126 U/L   Total Bilirubin 0.4 0.3 - 1.2 mg/dL   GFR calc non Af Amer >60 >60 mL/min   GFR calc Af Amer >60 >60 mL/min    Comment: (NOTE) The eGFR has been calculated using the CKD EPI equation. This calculation has not been validated in all clinical situations. eGFR's persistently <60 mL/min signify possible Chronic Kidney Disease.    Anion gap 10 5 - 15    Comment: Performed at [facility], [place] [address], [place], [place] [address]  Salicylate level     Status: None   Collection Time: 07/10/17  7:38 AM  Result Value Ref Range   Salicylate Lvl <7.8 2.8 -  30.0 mg/dL    Comment: Performed at [facility], [place] [address] [place], [place] [address]  Acetaminophen level     Status: Abnormal   Collection Time: 07/10/17  7:38 AM  Result Value Ref Range   Acetaminophen (Tylenol), Serum <10 (L) 10 - 30 ug/mL    Comment:        THERAPEUTIC CONCENTRATIONS VARY SIGNIFICANTLY. A RANGE OF 10-30 ug/mL MAY BE AN EFFECTIVE CONCENTRATION FOR MANY PATIENTS. HOWEVER, SOME ARE BEST TREATED AT CONCENTRATIONS OUTSIDE THIS RANGE. ACETAMINOPHEN CONCENTRATIONS >150 ug/mL AT 4 HOURS AFTER INGESTION AND >50 ug/mL AT 12 HOURS AFTER INGESTION ARE OFTEN ASSOCIATED WITH TOXIC REACTIONS. Performed at [facility], [place] [address], [place], [place] [address]   Ethanol     Status: None   Collection Time: 07/10/17  7:38 AM  Result Value Ref Range   Alcohol, Ethyl (B) <10 <10 mg/dL    Comment:        LOWEST DETECTABLE LIMIT FOR SERUM ALCOHOL IS 10 mg/dL FOR MEDICAL PURPOSES ONLY Performed at [facility] [address], [place], [place] [address]   CBC WITH DIFFERENTIAL     Status: Abnormal   Collection Time: 07/10/17  7:38 AM  Result Value Ref Range   WBC 3.9 (L) 4.0 - 10.5 K/uL   RBC 5.39 (H) 3.87 - 5.11 MIL/uL   Hemoglobin 14.9 12.0 - 15.0 g/dL   HCT 44.7 36.0 - 46.0 %   MCV 82.9 78.0 - 100.0 fL   MCH 27.6 26.0 - 34.0 pg   MCHC 33.3 30.0 - 36.0 g/dL   RDW 16.8 (H) 11.5 - 15.5 %   Platelets 256 150 - 400 K/uL   Neutrophils Relative % 83 %   Neutro Abs 3.2 1.7 - 7.7 K/uL   Lymphocytes Relative 11 %   Lymphs Abs 0.4 (L) 0.7 - 4.0 K/uL   Monocytes Relative 6 %   Monocytes Absolute 0.3 0.1 - 1.0 K/uL   Eosinophils Relative 0 %   Eosinophils Absolute 0.0 0.0 - 0.7 K/uL   Basophils Relative 0 %   Basophils Absolute 0.0 0.0 - 0.1 K/uL    Comment:  Performed at [facility], [place] [address], [place], [place] [address]  I-Stat beta hCG blood, ED     Status: Abnormal   Collection  Time: 07/10/17  8:09 AM  Result Value Ref Range   I-stat hCG, quantitative 5.4 (H) <5 mIU/mL   Comment 3            Comment:   GEST. AGE      CONC.  (mIU/mL)   <=1 WEEK        5 - 50     2 WEEKS       50 - 500     3 WEEKS       100 - 10,000     4 WEEKS     1,000 - 30,000        FEMALE AND NON-PREGNANT FEMALE:     LESS THAN 5 mIU/mL    Ct Abdomen Pelvis W Contrast  Result Date: 07/10/2017 CLINICAL DATA:  Right lower abdominal pain x1 day EXAM: CT ABDOMEN AND PELVIS WITH CONTRAST TECHNIQUE: Multidetector CT imaging of the abdomen and pelvis was performed using the standard protocol following bolus administration of intravenous contrast. CONTRAST:  144m ISOVUE-300 IOPAMIDOL (ISOVUE-300) INJECTION 61% COMPARISON:  None. FINDINGS: Lower chest: Lung bases are clear. Hepatobiliary: 12 mm cyst in the central right liver. Gallbladder is unremarkable. No intrahepatic or extrahepatic ductal dilatation. Pancreas: Within normal limits. Spleen: Within normal limits. Adrenals/Urinary Tract: Adrenal glands are within normal limits. Kidneys are within normal limits.  No hydronephrosis. Bladder is within normal limits. Stomach/Bowel: Mild submucosal edema in the region of the distal gastric antrum and proximal duodenum, best visualized on sagittal imaging (sagittal images 61 and 53). Given the additional findings below, this appearance is worrisome for possible gastroduodenal ulcer. No evidence of bowel obstruction. Normal appendix (series 2/image 41). Vascular/Lymphatic: No evidence of abdominal aortic aneurysm. No suspicious abdominopelvic lymphadenopathy. Reproductive: Enlarged uterus with dominant 9.9 x 7.4 x 9.4 cm intramural posterior uterine body mass (sagittal image 56), likely reflecting a very large uterine fibroid. Left ovary is within normal limits (series 2/image 54). Dilated tubular structure posterior the right ovary (series 2/image 62), favoring hydrosalpinx. Given the posterior location in the pelvis,  this would be difficult to assess sonographically. Other: Moderate abdominopelvic ascites. Moderate pneumoperitoneum, predominantly along the anterior abdomen. Musculoskeletal: Mild degenerative changes at L3-4. IMPRESSION: Moderate pneumoperitoneum with associated abdominopelvic ascites, indicating a perforated hollow viscus. Given mild submucosal edema in the region of the distal gastric antrum and proximal duodenum, this raises a perforated gastroduodenal ulcer as the most likely etiology. 9.9 cm posterior uterine body mass, favoring a large uterine fibroid. Additional ancillary findings as above. Critical Value/emergent results were called by telephone at the time of interpretation on 07/10/2017 at 10:03 am to Dr. PAddison Lank, who verbally acknowledged these results. Electronically Signed   By: SJulian HyM.D.   On: 07/10/2017 10:06    Review of Systems  Constitutional: Negative for weight loss.  HENT: Negative for ear discharge, ear pain, hearing loss and tinnitus.   Eyes: Negative for blurred vision, double vision, photophobia and pain.  Respiratory: Negative for cough, sputum production and shortness of breath.   Cardiovascular: Negative for chest pain.  Gastrointestinal: Positive for abdominal pain. Negative for nausea and vomiting.  Genitourinary: Negative for dysuria, flank pain, frequency and urgency.  Musculoskeletal: Negative for back pain, falls, joint pain, myalgias and neck pain.  Neurological: Negative for dizziness, tingling, sensory change, focal weakness, loss of consciousness  and headaches.  Endo/Heme/Allergies: Does not bruise/bleed easily.  Psychiatric/Behavioral: Negative for depression, memory loss and substance abuse. The patient is not nervous/anxious.     Blood pressure (!) 145/92, pulse (!) 115, temperature 99.3 F (37.4 C), temperature source Oral, resp. rate 20, last menstrual period 06/12/2017, SpO2 97 %. Physical Exam  WDWN in mild distress Eyes:  Pupils  equal, round; sclera anicteric HENT:  Oral mucosa moist; good dentition  Neck:  No masses palpated, no thyromegaly Lungs:  CTA bilaterally; normal respiratory effort CV:  Regular rate and rhythm; no murmurs; extremities well-perfused with no edema Abd:  +bowel sounds, distended; very tender to palpation with rebound and guarding Skin:  Warm, dry; no sign of jaundice Psychiatric - alert and oriented x 4; calm mood and affect  Assessment/Plan Perforated viscus - likely perforated gastric or duodenal ulcer secondary to heavy NSAID use.  TO OR for exploratory laparotomy, repair of ulcer (Graham patch).  The surgical procedure has been discussed with the patient.  Potential risks, benefits, alternative treatments, and expected outcomes have been explained.  All of the patient's questions at this time have been answered.  The likelihood of reaching the patient's treatment goal is good.  The patient understand the proposed surgical procedure and wishes to proceed.   Maia Petties, MD 07/10/2017, 10:17 AM

## 2017-07-10 NOTE — ED Notes (Signed)
Bed: [id number] Expected date:  Expected time:  Means of arrival:  Comments: EMS 47 yo female abdominal and flank pain

## 2017-07-10 NOTE — ED Triage Notes (Signed)
Pt comes to ed via, ems, from home right lower abdominal pain x 1 day. Pt took a estimated 20 ibuprofen in last 24 hrs. Pt is alert x4, v/s on arrival 164/94, hr110, rr14, SpO2 99 room air.  [facility] for [facility] issues, which why ttok some many ibuprofen.

## 2017-07-10 NOTE — ED Notes (Signed)
Called PC, recommendations of electrolyte panel, fluids and observation

## 2017-07-10 NOTE — Anesthesia Procedure Notes (Signed)
Procedure Name: Intubation Date/Time: 07/10/2017 12:10 PM Performed by: Stephenie Navejas D, CRNA Pre-anesthesia Checklist: Patient identified, Emergency Drugs available, Suction available and Patient being monitored Patient Re-evaluated:Patient Re-evaluated prior to induction Oxygen Delivery Method: Circle system utilized Preoxygenation: Pre-oxygenation with 100% oxygen Induction Type: IV induction, Rapid sequence and Cricoid Pressure applied Laryngoscope Size: Mac and 3 Grade View: Grade I Tube type: Oral Number of attempts: 1 Airway Equipment and Method: Stylet Placement Confirmation: ETT inserted through vocal cords under direct vision,  positive ETCO2 and breath sounds checked- equal and bilateral Secured at: 21 cm Tube secured with: Tape Dental Injury: Teeth and Oropharynx as per pre-operative assessment

## 2017-07-10 NOTE — Transfer of Care (Signed)
Immediate Anesthesia Transfer of Care Note  Patient: Sherri Gilbert  Procedure(s) Performed: EXPLORATORY LAPAROTOMY REPAIR OF PERFORATED ULCER AT PYLORUS (N/A Abdomen)  Patient Location: PACU  Anesthesia Type:General  Level of Consciousness: awake, alert  and oriented  Airway & Oxygen Therapy: Patient Spontanous Breathing and Patient connected to face mask oxygen  Post-op Assessment: Report given to RN and Post -op Vital signs reviewed and stable  Post vital signs: Reviewed and stable  Last Vitals:  Vitals:   07/10/17 0519 07/10/17 0706  BP:  (!) 145/92  Pulse:  (!) 115  Resp:  20  Temp:  37.4 C  SpO2: 100% 97%    Last Pain:  Vitals:   07/10/17 0706  TempSrc: Oral  PainSc:          Complications: No apparent anesthesia complications

## 2017-07-10 NOTE — ED Notes (Signed)
We take her to O.R. At this time, where she is received by Sue LushAndrea, RN. Her clothing (only) is placed into pt. Belonging cabinet directly across from E.D. 18. Valuables are taken to security and locked; and the key is kept with pt. Chart. With pt.

## 2017-07-10 NOTE — Anesthesia Preprocedure Evaluation (Signed)
Anesthesia Evaluation  Patient identified by MRN, date of birth, ID band Patient awake    Reviewed: Allergy & Precautions, NPO status , Patient's Chart, lab work & pertinent test results  History of Anesthesia Complications Negative for: history of anesthetic complications  Airway Mallampati: I  TM Distance: >3 FB Neck ROM: Full    Dental  (+) Teeth Intact,    Pulmonary asthma , Current Smoker,    breath sounds clear to auscultation       Cardiovascular negative cardio ROS   Rhythm:Regular     Neuro/Psych negative neurological ROS  negative psych ROS   GI/Hepatic Neg liver ROS, perforated ulcer    Endo/Other  negative endocrine ROS  Renal/GU negative Renal ROS     Musculoskeletal negative musculoskeletal ROS (+)   Abdominal   Peds  Hematology   Anesthesia Other Findings   Reproductive/Obstetrics                             Anesthesia Physical Anesthesia Plan  ASA: II and emergent  Anesthesia Plan: General   Post-op Pain Management:    Induction: Intravenous, Rapid sequence and Cricoid pressure planned  PONV Risk Score and Plan: 2 and Ondansetron and Dexamethasone  Airway Management Planned: Oral ETT  Additional Equipment: None  Intra-op Plan:   Post-operative Plan: Extubation in OR  Informed Consent: I have reviewed the patients History and Physical, chart, labs and discussed the procedure including the risks, benefits and alternatives for the proposed anesthesia with the patient or authorized representative who has indicated his/her understanding and acceptance.   Dental advisory given  Plan Discussed with: CRNA and Surgeon  Anesthesia Plan Comments:         Anesthesia Quick Evaluation

## 2017-07-11 ENCOUNTER — Encounter (HOSPITAL_COMMUNITY): Payer: Self-pay | Admitting: Surgery

## 2017-07-11 LAB — BASIC METABOLIC PANEL
ANION GAP: 10 (ref 5–15)
BUN: 15 mg/dL (ref 6–20)
CHLORIDE: 109 mmol/L (ref 101–111)
CO2: 24 mmol/L (ref 22–32)
Calcium: 8.7 mg/dL — ABNORMAL LOW (ref 8.9–10.3)
Creatinine, Ser: 0.77 mg/dL (ref 0.44–1.00)
GFR calc non Af Amer: >60 mL/min (ref >60)
GLUCOSE: 109 mg/dL — AB (ref 65–99)
POTASSIUM: 3.7 mmol/L (ref 3.5–5.1)
Sodium: 143 mmol/L (ref 135–145)

## 2017-07-11 LAB — CBC
HCT: 39 % (ref 36.0–46.0)
Hemoglobin: 12.8 g/dL (ref 12.0–15.0)
MCH: 26.8 pg (ref 26.0–34.0)
MCHC: 32.8 g/dL (ref 30.0–36.0)
MCV: 81.6 fL (ref 78.0–100.0)
Platelets: 271 10*3/uL (ref 150–400)
RBC: 4.78 MIL/uL (ref 3.87–5.11)
RDW: 17.3 % — AB (ref 11.5–15.5)
WBC: 23.3 10*3/uL — ABNORMAL HIGH (ref 4.0–10.5)

## 2017-07-11 MED ORDER — MENTHOL 3 MG MT LOZG
1.0000 | LOZENGE | OROMUCOSAL | Status: DC | PRN
  Administered 2017-07-11: 3 mg via ORAL
  Filled 2017-07-11: qty 9

## 2017-07-11 MED ORDER — PANTOPRAZOLE SODIUM 40 MG IV SOLR
40.0000 mg | Freq: Two times a day (BID) | INTRAVENOUS | Status: DC
  Administered 2017-07-11 – 2017-07-16 (×11): 40 mg via INTRAVENOUS
  Filled 2017-07-11 (×13): qty 40

## 2017-07-11 MED ORDER — PHENOL 1.4 % MT LIQD
1.0000 | OROMUCOSAL | Status: DC | PRN
  Administered 2017-07-11: 1 via OROMUCOSAL
  Filled 2017-07-11: qty 177

## 2017-07-11 NOTE — Progress Notes (Signed)
1 Day Post-Op  Subjective: Stable and alert Pain control reasonable with PCA Has ambulated in hall NG drainage bilious`  WBC 23,000.  Hemoglobin 12.8.  Potassium 3.7.  Creatinine 0.77  Objective: Vital signs in last 24 hours: Temp:  [97.8 F (36.6 C)-99.6 F (37.6 C)] 97.8 F (36.6 C) (02/03 0913) Pulse Rate:  [92-108] 103 (02/03 0913) Resp:  [16-29] 18 (02/03 0913) BP: (120-154)/(71-92) 128/75 (02/03 0913) SpO2:  [96 %-100 %] 100 % (02/03 0913) Weight:  [59 kg (130 lb)] 59 kg (130 lb) (02/02 1532) Last BM Date: 07/08/17  Intake/Output from previous day: 02/02 0701 - 02/03 0700 In: 2801.7 [I.V.:2751.7; IV Piggyback:50] Out: 2160 [Urine:1360; Emesis/NG output:800] Intake/Output this shift: Total I/O In: 480 [P.O.:30; I.V.:400; IV Piggyback:50] Out: -   General appearance: Alert and cooperative.  Mild distress from incisional pain. Resp: clear to auscultation bilaterally GI: Soft.  Nondistended.  Appropriately tender.  Silent.  Negative pressure dressing on wound.  Lab Results:  Results for orders placed or performed during the hospital encounter of 07/10/17 (from the past 24 hour(s))  CBC     Status: Abnormal   Collection Time: 07/11/17  5:24 AM  Result Value Ref Range   WBC 23.3 (H) 4.0 - 10.5 K/uL   RBC 4.78 3.87 - 5.11 MIL/uL   Hemoglobin 12.8 12.0 - 15.0 g/dL   HCT 16.139.0 09.636.0 - 04.546.0 %   MCV 81.6 78.0 - 100.0 fL   MCH 26.8 26.0 - 34.0 pg   MCHC 32.8 30.0 - 36.0 g/dL   RDW 40.917.3 (H) 81.111.5 - 91.415.5 %   Platelets 271 150 - 400 K/uL  Basic metabolic panel     Status: Abnormal   Collection Time: 07/11/17  5:24 AM  Result Value Ref Range   Sodium 143 135 - 145 mmol/L   Potassium 3.7 3.5 - 5.1 mmol/L   Chloride 109 101 - 111 mmol/L   CO2 24 22 - 32 mmol/L   Glucose, Bld 109 (H) 65 - 99 mg/dL   BUN 15 6 - 20 mg/dL   Creatinine, Ser 7.820.77 0.44 - 1.00 mg/dL   Calcium 8.7 (L) 8.9 - 10.3 mg/dL   GFR calc non Af Amer >60 >60 mL/min   GFR calc Af Amer >60 >60 mL/min   Anion gap 10 5 - 15     Studies/Results: No results found.  . enoxaparin (LOVENOX) injection  40 mg Subcutaneous Q24H  . mouth rinse  15 mL Mouth Rinse BID  . morphine   Intravenous Q4H  . pantoprazole (PROTONIX) IV  40 mg Intravenous QHS     Assessment/Plan: s/p Procedure(s): EXPLORATORY LAPAROTOMY REPAIR OF PERFORATED ULCER AT PYLORUS  POD #1.  -  Gram patch closure of perforated prepyloric ulcer and application of negative pressure dressing  -NSAIDS implicatred - Increase Protonix to twice a day  -Zosyn -lovenox -Consider Gastrografin upper GI study tomorrow or Tuesday   Tobacco abuse -Push incentive spirometry @PROBHOSP @  LOS: 1 day    Sherri Gilbert M Kia Varnadore 07/11/2017  . .prob

## 2017-07-12 ENCOUNTER — Inpatient Hospital Stay (HOSPITAL_COMMUNITY): Payer: BLUE CROSS/BLUE SHIELD

## 2017-07-12 LAB — BASIC METABOLIC PANEL
Anion gap: 8 (ref 5–15)
BUN: 15 mg/dL (ref 6–20)
CHLORIDE: 110 mmol/L (ref 101–111)
CO2: 24 mmol/L (ref 22–32)
Calcium: 8.6 mg/dL — ABNORMAL LOW (ref 8.9–10.3)
Creatinine, Ser: 0.6 mg/dL (ref 0.44–1.00)
GFR calc non Af Amer: >60 mL/min (ref >60)
Glucose, Bld: 72 mg/dL (ref 65–99)
POTASSIUM: 3.2 mmol/L — AB (ref 3.5–5.1)
SODIUM: 142 mmol/L (ref 135–145)

## 2017-07-12 LAB — CBC
HCT: 33.3 % — ABNORMAL LOW (ref 36.0–46.0)
HEMOGLOBIN: 10.9 g/dL — AB (ref 12.0–15.0)
MCH: 27.3 pg (ref 26.0–34.0)
MCHC: 32.7 g/dL (ref 30.0–36.0)
MCV: 83.3 fL (ref 78.0–100.0)
Platelets: 237 10*3/uL (ref 150–400)
RBC: 4 MIL/uL (ref 3.87–5.11)
RDW: 17.4 % — ABNORMAL HIGH (ref 11.5–15.5)
WBC: 19.2 10*3/uL — ABNORMAL HIGH (ref 4.0–10.5)

## 2017-07-12 LAB — MAGNESIUM: MAGNESIUM: 2.1 mg/dL (ref 1.7–2.4)

## 2017-07-12 MED ORDER — POTASSIUM CHLORIDE 10 MEQ/100ML IV SOLN
10.0000 meq | INTRAVENOUS | Status: AC
Start: 2017-07-12 — End: 2017-07-13
  Administered 2017-07-12 – 2017-07-13 (×3): 10 meq via INTRAVENOUS

## 2017-07-12 MED ORDER — MORPHINE SULFATE (PF) 2 MG/ML IV SOLN
2.0000 mg | INTRAVENOUS | Status: DC | PRN
  Administered 2017-07-12 – 2017-07-13 (×2): 4 mg via INTRAVENOUS
  Filled 2017-07-12 (×2): qty 2

## 2017-07-12 MED ORDER — POTASSIUM CHLORIDE 10 MEQ/100ML IV SOLN
10.0000 meq | INTRAVENOUS | Status: AC
  Administered 2017-07-12: 10 meq via INTRAVENOUS
  Filled 2017-07-12 (×4): qty 100

## 2017-07-12 MED ORDER — ACETAMINOPHEN 650 MG RE SUPP: 650.0000 mg | Freq: Four times a day (QID) | RECTAL | Status: DC | PRN

## 2017-07-12 MED ORDER — DEXTROSE 5 % IV SOLN
1000.0000 mg | Freq: Four times a day (QID) | INTRAVENOUS | Status: DC | PRN
  Filled 2017-07-12: qty 10

## 2017-07-12 MED ORDER — BISACODYL 10 MG RE SUPP
10.0000 mg | Freq: Every day | RECTAL | Status: DC
  Administered 2017-07-12: 10 mg via RECTAL
  Filled 2017-07-12 (×5): qty 1

## 2017-07-12 MED ORDER — ACETAMINOPHEN 10 MG/ML IV SOLN
1000.0000 mg | Freq: Four times a day (QID) | INTRAVENOUS | Status: AC
  Administered 2017-07-12 – 2017-07-13 (×4): 1000 mg via INTRAVENOUS
  Filled 2017-07-12 (×4): qty 100

## 2017-07-12 MED ORDER — POTASSIUM CHLORIDE IN NACL 40-0.9 MEQ/L-% IV SOLN
INTRAVENOUS | Status: DC
  Administered 2017-07-12 (×2): 50 mL/h via INTRAVENOUS
  Filled 2017-07-12 (×2): qty 1000

## 2017-07-12 MED ORDER — METHOCARBAMOL 1000 MG/10ML IJ SOLN
1000.0000 mg | Freq: Three times a day (TID) | INTRAVENOUS | Status: DC
  Administered 2017-07-12 – 2017-07-17 (×15): 1000 mg via INTRAVENOUS
  Filled 2017-07-12 (×19): qty 10

## 2017-07-12 MED ORDER — IOPAMIDOL (ISOVUE-300) INJECTION 61%
INTRAVENOUS | Status: AC
  Filled 2017-07-12: qty 150

## 2017-07-12 MED ORDER — IOPAMIDOL (ISOVUE-300) INJECTION 61%
150.0000 mL | Freq: Once | INTRAVENOUS | Status: AC | PRN
  Administered 2017-07-12: 11:00:00 via ORAL
  Administered 2017-07-12: 150 mL via ORAL

## 2017-07-12 NOTE — Progress Notes (Signed)
2 Days Post-Op    CC: Abdominal pain  Subjective: Patient was taking relatively high dose of ibuprofen for chronic back pain which she feels is muscular in nature.  They wanted to go back to work, so she was using the ibuprofen.  Currently her NG is still putting out a fair amount, no flatus, no BM.  Objective: Vital signs in last 24 hours: Temp:  [98.3 F (36.8 C)-99.1 F (37.3 C)] 99.1 F (37.3 C) (02/04 0852) Pulse Rate:  [97-104] 102 (02/04 0852) Resp:  [15-27] 22 (02/04 0852) BP: (125-144)/(62-83) 144/83 (02/04 0852) SpO2:  [96 %-100 %] 98 % (02/04 0852) Last BM Date: 07/09/17 150 p.o.  2500 IV  700 urine 1150 NG No BM Afebrile vital signs are stable, slightly tachycardic. K+ 3.2 WBC still elevated at 19.2 Admission drug screen positive for barbiturates and THC  UGI this a.m. there is a normal appearance of the esophagus slow emptying of the stomach, some degree of postoperative gastroparesis.  No visible leak or extravasation of the contrast duodenal sweep was grossly unremarkable.    ntake/Output from previous day: 02/03 0701 - 02/04 0700 In: 2709 [P.O.:150; I.V.:2359; IV Piggyback:200] Out: 1850 [Urine:700; Emesis/NG output:1150] Intake/Output this shift: No intake/output data recorded.  General appearance: alert, cooperative and no distress Resp: clear to auscultation bilaterally GI: Few bowel sounds.  Fair amount of drainage from the NG.  No flatus or BM.  UGI this a.m. shows no leaks.  Lab Results:  Recent Labs    07/11/17 0524 07/12/17 0438  WBC 23.3* 19.2*  HGB 12.8 10.9*  HCT 39.0 33.3*  PLT 271 237    BMET Recent Labs    07/11/17 0524 07/12/17 0438  NA 143 142  K 3.7 3.2*  CL 109 110  CO2 24 24  GLUCOSE 109* 72  BUN 15 15  CREATININE 0.77 0.60  CALCIUM 8.7* 8.6*   PT/INR No results for input(s): LABPROT, INR in the last 72 hours.  Recent Labs  Lab 07/10/17 0738  AST 17  ALT 11*  ALKPHOS 56  BILITOT 0.4  PROT 7.2  ALBUMIN  4.1     Lipase  No results found for: LIPASE   Medications: . enoxaparin (LOVENOX) injection  40 mg Subcutaneous Q24H  . mouth rinse  15 mL Mouth Rinse BID  . morphine   Intravenous Q4H  . pantoprazole (PROTONIX) IV  40 mg Intravenous Q12H   . 0.9 % NaCl with KCl 20 mEq / L 100 mL/hr at 07/12/17 0232  . piperacillin-tazobactam (ZOSYN)  IV Stopped (07/12/17 [id number])   Anti-infectives (From admission, onward)   Start     Dose/Rate Route Frequency Ordered Stop   07/10/17 1600  piperacillin-tazobactam (ZOSYN) IVPB 3.375 g     3.375 g 12.5 mL/hr over 240 Minutes Intravenous Every 8 hours 07/10/17 1544     07/10/17 1030  piperacillin-tazobactam (ZOSYN) IVPB 3.375 g     3.375 g 100 mL/hr over 30 Minutes Intravenous  Once 07/10/17 1016 07/10/17 1100      Assessment/Plan Perforated prepyloric ulcer Exploratory laparotomy with Cheree DittoGraham patch of perforated prepyloric ulcer/placement of subcutaneous wound VAC(2 x 15 cm), 07/10/17 Dr. Manus RuddMatthew Tsuei.  NSAID use History of asthma Hypokalemia -check magnesium and replace potassium  FEN: IV fluids/n.p.o. ID: Zosyn 07/10/17 =>> day 3 DVT: Lovenox Foley: None Follow-up: Dr. Manus RuddMatthew Tsuei   Plan: Continue NG and ice chips for now.  She is using her morphine about every hour.  We will start her on some  IV Tylenol for that.  H. pylori has been requested, continue antibiotics, and PPI.  Replace potassium and check magnesium.       LOS: 2 days    Sherri Gilbert 07/12/2017 [phone]

## 2017-07-13 ENCOUNTER — Inpatient Hospital Stay (HOSPITAL_COMMUNITY): Payer: BLUE CROSS/BLUE SHIELD

## 2017-07-13 LAB — BASIC METABOLIC PANEL
Anion gap: 7 (ref 5–15)
BUN: 14 mg/dL (ref 6–20)
CHLORIDE: 107 mmol/L (ref 101–111)
CO2: 27 mmol/L (ref 22–32)
CREATININE: 0.52 mg/dL (ref 0.44–1.00)
Calcium: 8.9 mg/dL (ref 8.9–10.3)
GFR calc Af Amer: >60 mL/min (ref >60)
GFR calc non Af Amer: >60 mL/min (ref >60)
GLUCOSE: 118 mg/dL — AB (ref 65–99)
POTASSIUM: 3.4 mmol/L — AB (ref 3.5–5.1)
Sodium: 141 mmol/L (ref 135–145)

## 2017-07-13 LAB — CBC
HEMATOCRIT: 33.2 % — AB (ref 36.0–46.0)
Hemoglobin: 10.5 g/dL — ABNORMAL LOW (ref 12.0–15.0)
MCH: 26.4 pg (ref 26.0–34.0)
MCHC: 31.6 g/dL (ref 30.0–36.0)
MCV: 83.6 fL (ref 78.0–100.0)
PLATELETS: 240 10*3/uL (ref 150–400)
RBC: 3.97 MIL/uL (ref 3.87–5.11)
RDW: 17.3 % — AB (ref 11.5–15.5)
WBC: 13.7 10*3/uL — AB (ref 4.0–10.5)

## 2017-07-13 LAB — H. PYLORI ANTIBODY, IGG: H PYLORI IGG: 4.42 {index_val} — AB (ref 0.00–0.79)

## 2017-07-13 MED ORDER — HYDROMORPHONE HCL 1 MG/ML IJ SOLN
0.5000 mg | INTRAMUSCULAR | Status: DC | PRN
  Administered 2017-07-13: 0.5 mg via INTRAVENOUS
  Administered 2017-07-14 – 2017-07-17 (×8): 1 mg via INTRAVENOUS
  Filled 2017-07-13 (×9): qty 1

## 2017-07-13 NOTE — Progress Notes (Signed)
PT Cancellation Note  Patient Details Name: Burnard LeighDior Kneisel MRN: [id number] DOB: 05-06-1971   Cancelled Treatment:    Reason Eval/Treat Not Completed: Fatigue/lethargy limiting ability to participate(pt reports she's very tired and wants to rest right now. Will reattempt tomorrow. )   Ralene BatheUhlenberg, Deagan Sevin Kistler 07/13/2017, 2:25 PM [phone]

## 2017-07-13 NOTE — Progress Notes (Signed)
PT Cancellation Note  Patient Details Name: Sherri Gilbert MRN: [id number] DOB: [birth year]   Cancelled Treatment:    Reason Eval/Treat Not Completed: Medical issues which prohibited therapy(pt stated she isn't able to walk right now because she feels "loopy". Will try later today. )   Tamala SerUhlenberg, Yuta Cipollone Kistler 07/13/2017, 10:54 AM [phone]

## 2017-07-13 NOTE — Progress Notes (Signed)
[place]  [address] [place], [place] [address] Phone: [phone]  FAX: [phone]      Sherri Gilbert [id number] 09/28/1970  CARE TEAM:  PCP: Sherri Gilbert, No Pcp Per  Outpatient Care Team: Sherri Gilbert Care Team: Sherri Gilbert, No Pcp Per as PCP - General (General Practice)  Inpatient Treatment Team: Treatment Team: Attending Provider: Edison Pace, Md, MD; Technician: Sueanne Margarita, NT; Occupational Therapist: Lesle Chris, OT; Student Nurse: Denver Faster; Physical Therapist: Lucile Crater, PT   Problem List:   Principal Problem:   Perforated gastric ulcer s/p omental Phillip Heal patch 07/10/2017   3 Days Post-Op  07/10/2017  Procedure(s): EXPLORATORY LAPAROTOMY REPAIR OF PERFORATED ULCER AT [facility]   Assessment  Ileus may be resolving  Plan:  -Retry clamping trial with improved x-ray and large bowel movement. Switch from morphine to Dilaudid since sedating. Proton pump inhibitor -VTE prophylaxis- SCDs, etc -mobilize as tolerated to help recovery  20 minutes spent in review, evaluation, examination, counseling, and coordination of care.  More than 50% of that time was spent in counseling.  Adin Hector, M.D., F.A.C.S. Gastrointestinal and Minimally Invasive Surgery [facility] [address], [place], [place] [address] [phone] Main / Paging   07/13/2017    Subjective: (Chief complaint)  Tired of nasogastric tube.  However cannot tolerate clamping was quite nauseated with retching.  Placed back in suction.  Not mass or return.  Had large bowel movement and flatus this morning.  Feeling better.  Very sleepy on morphine.  Wishes to get off that  Objective:  Vital signs:  Vitals:   07/12/17 1500 07/12/17 2106 07/13/17 0619 07/13/17 0752  BP:  (!) 142/102 137/81 140/70  Pulse:  90 78 72  Resp: (!) 25 (!) 22 20 18   Temp:  98.5 F (36.9 C) 98.5 F  (36.9 C) 98.3 F (36.8 C)  TempSrc:  Oral Oral Oral  SpO2: 98% 99% 99% 98%  Weight:      Height:        Last BM Date: 07/09/17  Intake/Output   Yesterday:  02/04 0701 - 02/05 0700 In: 1871.7 [P.O.:150; I.V.:791.7; IV Piggyback:930] Out: 500 [Urine:500] This shift:  No intake/output data recorded.  Bowel function:  Flatus: YES  BM:  YES  Drain: (No drain)   Physical Exam:  General: Pt awake/alert/oriented x4 in mild acute distress Eyes: PERRL, normal EOM.  Sclera clear.  No icterus Neuro: CN II-XII intact w/o focal sensory/motor deficits. Lymph: No head/neck/groin lymphadenopathy Psych:  No delerium/psychosis/paranoia.  Sleepy but perks up HENT: Normocephalic, Mucus membranes moist.  No thrush Neck: Supple, No tracheal deviation Chest: No chest wall pain w good excursion CV:  Pulses intact.  Regular rhythm MS: Normal AROM mjr joints.  No obvious deformity  Abdomen: Soft.  Mildy distended.  Mildly tender at incisions only.  No evidence of peritonitis.  No incarcerated hernias.  Ext:  No deformity.  No mjr edema.  No cyanosis Skin: No petechiae / purpura  Results:   Labs: Results for orders placed or performed during the hospital encounter of 07/10/17 (from the past 48 hour(s))  CBC     Status: Abnormal   Collection Time: 07/12/17  4:38 AM  Result Value Ref Range   WBC 19.2 (H) 4.0 - 10.5 K/uL   RBC 4.00 3.87 - 5.11 MIL/uL   Hemoglobin 10.9 (L) 12.0 - 15.0 g/dL   HCT 33.3 (L) 36.0 - 46.0 %   MCV  83.3 78.0 - 100.0 fL   MCH 27.3 26.0 - 34.0 pg   MCHC 32.7 30.0 - 36.0 g/dL   RDW 17.4 (H) 11.5 - 15.5 %   Platelets 237 150 - 400 K/uL    Comment: Performed at [facility], [place] [address], [place], [place] [address]  Basic metabolic panel     Status: Abnormal   Collection Time: 07/12/17  4:38 AM  Result Value Ref Range   Sodium 142 135 - 145 mmol/L   Potassium 3.2 (L) 3.5 - 5.1 mmol/L   Chloride 110 101 - 111 mmol/L   CO2 24 22 - 32 mmol/L    Glucose, Bld 72 65 - 99 mg/dL   BUN 15 6 - 20 mg/dL   Creatinine, Ser 0.60 0.44 - 1.00 mg/dL   Calcium 8.6 (L) 8.9 - 10.3 mg/dL   GFR calc non Af Amer >60 >60 mL/min   GFR calc Af Amer >60 >60 mL/min    Comment: (NOTE) The eGFR has been calculated using the CKD EPI equation. This calculation has not been validated in all clinical situations. eGFR's persistently <60 mL/min signify possible Chronic Kidney Disease.    Anion gap 8 5 - 15    Comment: Performed at [facility], [place] [address], [place], [place] [address]  Magnesium     Status: None   Collection Time: 07/12/17  4:38 AM  Result Value Ref Range   Magnesium 2.1 1.7 - 2.4 mg/dL    Comment: Performed at [facility], [place] [address], [place], [place] [address]  Basic metabolic panel     Status: Abnormal   Collection Time: 07/13/17  5:37 AM  Result Value Ref Range   Sodium 141 135 - 145 mmol/L   Potassium 3.4 (L) 3.5 - 5.1 mmol/L   Chloride 107 101 - 111 mmol/L   CO2 27 22 - 32 mmol/L   Glucose, Bld 118 (H) 65 - 99 mg/dL   BUN 14 6 - 20 mg/dL   Creatinine, Ser 0.52 0.44 - 1.00 mg/dL   Calcium 8.9 8.9 - 10.3 mg/dL   GFR calc non Af Amer >60 >60 mL/min   GFR calc Af Amer >60 >60 mL/min    Comment: (NOTE) The eGFR has been calculated using the CKD EPI equation. This calculation has not been validated in all clinical situations. eGFR's persistently <60 mL/min signify possible Chronic Kidney Disease.    Anion gap 7 5 - 15    Comment: Performed at [facility], [place] [address] [place], [place] [address]  CBC     Status: Abnormal   Collection Time: 07/13/17  5:37 AM  Result Value Ref Range   WBC 13.7 (H) 4.0 - 10.5 K/uL   RBC 3.97 3.87 - 5.11 MIL/uL   Hemoglobin 10.5 (L) 12.0 - 15.0 g/dL   HCT 33.2 (L) 36.0 - 46.0 %   MCV 83.6 78.0 - 100.0 fL   MCH 26.4 26.0 - 34.0 pg   MCHC 31.6 30.0 - 36.0 g/dL   RDW 17.3 (H) 11.5 - 15.5 %   Platelets 240 150 - 400 K/uL     Comment: Performed at [facility], [place] [address], [place], [place] [address]    Imaging / Studies: Dg Abd Acute W/chest  Result Date: 07/13/2017 CLINICAL DATA:  Followup postoperative ileus. Status post surgery for perforated pyloric ulcer. EXAM: DG ABDOMEN ACUTE W/ 1V CHEST COMPARISON:  CT scan 07/10/2017 and upper GI 07/12/2017  FINDINGS: Most of the contrast from the water-soluble upper GI on 07-30-17 has passed into the colon with some contrast noted in the rectum. There are some air-filled loops of small bowel and colon with scattered air-fluid levels which could reflect a mild persistent postoperative ileus. No free air or extravasated contrast. The NG tube tip is in the body region of the stomach. IMPRESSION: Probable mild postoperative ileus but most of the contrast has passed into the colon. Left sided pleural effusion and left lower lobe atelectasis. The NG tube tip is in the body region of the stomach. Electronically Signed   By: Marijo Sanes M.D.   On: 07/13/2017 10:06   Dg Duanne Limerick W/water Sol Cm  Result Date: 07/30/2017 CLINICAL DATA:  Perforated ulcer EXAM: WATER SOLUBLE UPPER GI SERIES TECHNIQUE: Single-column upper GI series was performed using water soluble contrast. CONTRAST:  50 mL Isovue 300 COMPARISON:  CT 07/10/2017 FLUOROSCOPY TIME:  Fluoroscopy Time:  2.1 min Radiation Exposure Index (if provided by the fluoroscopic device): 33.3 mGy Number of Acquired Spot Images: 0 FINDINGS: Scout film demonstrates no free air. NG tube is in the stomach. Nonobstructive bowel gas pattern. Water-soluble upper GI was performed. This shows normal appearance of the esophagus. Slow emptying of the stomach, likely related to some degree of postoperative gastroparesis. No visible leak or extravasation of contrast. Duodenal sweep grossly unremarkable. IMPRESSION: No evidence of leak from the stomach or duodenum. Electronically Signed   By: Rolm Baptise M.D.   On: July 30, 2017 09:46     Medications / Allergies: per chart  Antibiotics: Anti-infectives (From admission, onward)   Start     Dose/Rate Route Frequency Ordered Stop   07/10/17 1600  piperacillin-tazobactam (ZOSYN) IVPB 3.375 g     3.375 g 12.5 mL/hr over 240 Minutes Intravenous Every 8 hours 07/10/17 1544 07/15/17 1359   07/10/17 1030  piperacillin-tazobactam (ZOSYN) IVPB 3.375 g     3.375 g 100 mL/hr over 30 Minutes Intravenous  Once 07/10/17 1016 07/10/17 1100        Note: Portions of this report may have been transcribed using voice recognition software. Every effort was made to ensure accuracy; however, inadvertent computerized transcription errors may be present.   Any transcriptional errors that result from this process are unintentional.     Adin Hector, M.D., F.A.C.S. Gastrointestinal and Minimally Invasive Surgery [facility] [address] [place], [place] [address] [phone] Main / Paging   07/13/2017

## 2017-07-13 NOTE — Progress Notes (Signed)
OT Cancellation Note  Patient Details Name: Sherri Gilbert MRN: [id number] DOB: Oct 24, 1970   Cancelled Treatment:    Reason Eval/Treat Not Completed: Fatigue/lethargy limiting ability to participate.  Pt reports she just got back to bed. Doesn't feel up to OT right now. Will likely check back tomorrow.  Timm Bonenberger 07/13/2017, 12:52 PM  Sherri Gilbert, OTR/L [phone] 07/13/2017

## 2017-07-14 MED ORDER — ENSURE SURGERY PO LIQD
237.0000 mL | Freq: Two times a day (BID) | ORAL | Status: DC
  Administered 2017-07-14 – 2017-07-16 (×5): 237 mL via ORAL
  Filled 2017-07-14 (×7): qty 237

## 2017-07-14 MED ORDER — OXYCODONE HCL 5 MG PO TABS
5.0000 mg | ORAL_TABLET | ORAL | Status: DC | PRN
  Administered 2017-07-14 (×2): 10 mg via ORAL
  Administered 2017-07-17: 5 mg via ORAL
  Filled 2017-07-14: qty 1
  Filled 2017-07-14 (×2): qty 2

## 2017-07-14 MED ORDER — PSYLLIUM 95 % PO PACK
1.0000 | PACK | Freq: Two times a day (BID) | ORAL | Status: DC
  Administered 2017-07-14 – 2017-07-17 (×5): 1 via ORAL
  Filled 2017-07-14 (×6): qty 1

## 2017-07-14 NOTE — Progress Notes (Signed)
[facility] Progress Note  4 Days Post-Op  Subjective: CC: abdominal pain Patient with increased abdominal pain and back spasms after lots of activity this AM. Denies nausea. Having BMs. Has not had VAC changed. UOP good. VSS.  Objective: Vital signs in last 24 hours: Temp:  [98.1 F (36.7 C)-99.4 F (37.4 C)] 98.1 F (36.7 C) (02/06 0506) Pulse Rate:  [56-75] 57 (02/06 0506) Resp:  [16-18] 16 (02/06 0506) BP: (138-158)/(63-82) 138/63 (02/06 0506) SpO2:  [99 %-100 %] 100 % (02/06 0506) Last BM Date: 07/13/17  Intake/Output from previous day: 02/05 0701 - 02/06 0700 In: 590 [P.O.:170; I.V.:200; IV Piggyback:220] Out: 75 [Emesis/NG output:75] Intake/Output this shift: No intake/output data recorded.  PE: Gen:  Alert, NAD, pleasant Card:  Regular rate and rhythm, pedal pulses 2+ BL Pulm:  Normal effort, clear to auscultation bilaterally Abd: Soft, appropriately tender, non-distended, bowel sounds present, no HSM, midline incision with VAC present Skin: warm and dry, no rashes  Psych: A&Ox3   Lab Results:  Recent Labs    August 06, 2017 0438 07/13/17 0537  WBC 19.2* 13.7*  HGB 10.9* 10.5*  HCT 33.3* 33.2*  PLT 237 240   BMET Recent Labs    2017-08-06 0438 07/13/17 0537  NA 142 141  K 3.2* 3.4*  CL 110 107  CO2 24 27  GLUCOSE 72 118*  BUN 15 14  CREATININE 0.60 0.52  CALCIUM 8.6* 8.9   PT/INR No results for input(s): LABPROT, INR in the last 72 hours. CMP     Component Value Date/Time   NA 141 07/13/2017 0537   K 3.4 (L) 07/13/2017 0537   CL 107 07/13/2017 0537   CO2 27 07/13/2017 0537   GLUCOSE 118 (H) 07/13/2017 0537   BUN 14 07/13/2017 0537   CREATININE 0.52 07/13/2017 0537   CALCIUM 8.9 07/13/2017 0537   PROT 7.2 07/10/2017 0738   ALBUMIN 4.1 07/10/2017 0738   AST 17 07/10/2017 0738   ALT 11 (L) 07/10/2017 0738   ALKPHOS 56 07/10/2017 0738   BILITOT 0.4 07/10/2017 0738   GFRNONAA >60 07/13/2017 0537   GFRAA >60 07/13/2017 0537    Lipase  No results found for: LIPASE     Studies/Results: Dg Abd Acute W/chest  Result Date: 07/13/2017 CLINICAL DATA:  Followup postoperative ileus. Status post surgery for perforated pyloric ulcer. EXAM: DG ABDOMEN ACUTE W/ 1V CHEST COMPARISON:  CT scan 07/10/2017 and upper GI 08/06/17 FINDINGS: Most of the contrast from the water-soluble upper GI on 08-06-2017 has passed into the colon with some contrast noted in the rectum. There are some air-filled loops of small bowel and colon with scattered air-fluid levels which could reflect a mild persistent postoperative ileus. No free air or extravasated contrast. The NG tube tip is in the body region of the stomach. IMPRESSION: Probable mild postoperative ileus but most of the contrast has passed into the colon. Left sided pleural effusion and left lower lobe atelectasis. The NG tube tip is in the body region of the stomach. Electronically Signed   By: Rudie Meyer M.D.   On: 07/13/2017 10:06    Anti-infectives: Anti-infectives (From admission, onward)   Start     Dose/Rate Route Frequency Ordered Stop   07/10/17 1600  piperacillin-tazobactam (ZOSYN) IVPB 3.375 g     3.375 g 12.5 mL/hr over 240 Minutes Intravenous Every 8 hours 07/10/17 1544 07/15/17 1359   07/10/17 1030  piperacillin-tazobactam (ZOSYN) IVPB 3.375 g     3.375 g 100 mL/hr over 30  Minutes Intravenous  Once 07/10/17 1016 07/10/17 1100       Assessment/Plan NSAID use History of asthma Hypokalemia - replace potassium  Perforated prepyloric ulcer Exploratory laparotomy with Cheree DittoGraham patch of perforated prepyloric ulcer/placement of subcutaneous wound VAC(2 x 15 cm), 07/10/17 Dr. Manus RuddMatthew Tsuei. - POD#4 - mobilize, OOB as tolerated - d/c NGT and start CLD - patient has not had VAC changed - will need to be changed today, please page gen surgery to look at wound  FEN: CLD, IVF ID: [email or web address] 07/10/17 >> DVT: Lovenox Foley: None Follow-up: Dr. Manus RuddMatthew Tsuei    LOS: 4 days     Wells GuilesKelly Rayburn , [facility] 07/14/2017, 11:23 AM Pager: [phone] Consults: [phone] Mon-Fri 7:00 am-4:30 pm Sat-Sun 7:00 am-11:30 am

## 2017-07-14 NOTE — Consult Note (Signed)
WOC Nurse wound consult note Reason for Consult:wound vac change Wound type: surgical full thickness Pressure Injury POA: NA Measurement:10 cm x 2.2cm x 2cm  Wound bed:red Drainage (amount, consistency, odor) scant serosanguinous  Periwound: intact Dressing procedure/placement/frequency: Wound vac removed with aadhesive remover, cleansed, dried with gauze. One piece black foam applied, suction to 125 mmHg, seal immediately achieved. Pt tolerated well. We will not follow, but will remain available to this patient, to nursing, and the medical and/or surgical teams. This will be a bedside RN dressing change, orders placed.  Please re-consult if we need to assist further.   Barnett HatterMelinda Ridwan Bondy, RN-C, WTA-C, OCA Wound Treatment Associate Ostomy Care Associate

## 2017-07-14 NOTE — Progress Notes (Signed)
   Wound clean with good granulation. Continue VAC dressing MWF.  Wells GuilesKelly Rayburn , [facility] 07/14/2017, 3:18 PM Pager: [phone] Consults: [phone] Mon-Fri 7:00 am-4:30 pm Sat-Sun 7:00 am-11:30 am

## 2017-07-14 NOTE — Evaluation (Signed)
Occupational Therapy Evaluation Patient Details Name: Sherri Gilbert Moeller MRN: [id number] DOB: 02-Aug-1970 Today's Date: 07/14/2017    History of Present Illness 47 year old female in good health who presents with a couple of days of "not feeling well".  Patient had some respiratory symptoms and poor appetite.  She also had some back pain.  Reportedly she took over 20 capsules of 200 mg ibuprofen within the last 24-36 hours before her back pain.  She also has not had much food to eat during this time.  She had acute onset of generalized abdominal pain beginning yesterday evening.  She was brought in by EMS and is found to have what appears to be a perforated viscus. s/p exploratory laparotomy with Cheree DittoGraham patch for prepyloric ulcer, wound VAC placement 07/10/17.   Clinical Impression   Pt was admitted for the above.  Will follow in acute with mod I level goals. Pt was independent prior to admission and her aunt will be able to assist her as needed.  Pt states she was able to complete ADL earlier, but she was not able to comfortably reach to socks at time of evaluation. She is not interested in AE at this time. Will further assess for tub DME; will try to clarify when she will be allowed to shower. Has NG and wound vac at this time     Follow Up Recommendations  Supervision - Intermittent    Equipment Recommendations  (likely none)    Recommendations for Other Services       Precautions / Restrictions Precautions Precautions: Fall Restrictions Weight Bearing Restrictions: No      Mobility Bed Mobility               General bed mobility comments: supervision, HOB raised, roll, sidelying <> sit  Transfers                 General transfer comment: supervision no AD    Balance                                           ADL either performed or assessed with clinical judgement   ADL Overall ADL's : Needs assistance/impaired             Lower Body  Bathing: Minimal assistance;Sit to/from stand       Lower Body Dressing: Moderate assistance;Sit to/from stand                 General ADL Comments: Pt is able to perform UB adls with set up. She states that she washed herself and put socks on this am. She was unable to cross legs  comfortably at time of evaluation. Showed AE, but she really doesn't want this:  I can issue it to her, if she changes her mind.  Educated on energy conservation.  Pt has a tub at home.  Did not practice sidestepping over at this time:  will follow up prior to d/c     Vision         Perception     Praxis      Pertinent Vitals/Pain Pain Assessment: Faces Faces Pain Scale: Hurts little more Pain Location: stomach Pain Descriptors / Indicators: Grimacing Pain Intervention(s): Limited activity within patient's tolerance;Monitored during session;Repositioned     Hand Dominance     Extremity/Trunk Assessment Upper Extremity Assessment Upper Extremity Assessment: Overall WFL for tasks assessed  Communication Communication Communication: No difficulties   Cognition Arousal/Alertness: Awake/alert Behavior During Therapy: WFL for tasks assessed/performed Overall Cognitive Status: Within Functional Limits for tasks assessed                                     General Comments       Exercises     Shoulder Instructions      Home Living Family/patient expects to be discharged to:: Private residence Living Arrangements: Alone Available Help at Discharge: Family(aunt)               Bathroom Shower/Tub: Chief Strategy Officer: Standard                Prior Functioning/Environment Level of Independence: Independent                 OT Problem List: Decreased activity tolerance;Pain;Decreased knowledge of use of DME or AE      OT Treatment/Interventions: Self-care/ADL training;DME and/or AE instruction;Patient/family education     OT Goals(Current goals can be found in the care plan section) Acute Rehab OT Goals Patient Stated Goal: return to independence OT Goal Formulation: With patient Time For Goal Achievement: 07/28/17 Potential to Achieve Goals: Good ADL Goals Pt Will Transfer to Toilet: with modified independence;ambulating;regular height toilet Pt Will Perform Tub/Shower Transfer: Tub transfer;with modified independence;ambulating(? seat) Additional ADL Goal #1: pt will gather clothes and complete ADL at mod I level using AE if needed and initiating a rest break if needed for energy conservation  OT Frequency: Min 2X/week   Barriers to D/C:            Co-evaluation              AM-PAC PT "6 Clicks" Daily Activity     Outcome Measure Help from another person eating meals?: None Help from another person taking care of personal grooming?: A Little Help from another person toileting, which includes using toliet, bedpan, or urinal?: A Little Help from another person bathing (including washing, rinsing, drying)?: A Little Help from another person to put on and taking off regular upper body clothing?: A Little Help from another person to put on and taking off regular lower body clothing?: A Lot 6 Click Score: 18   End of Session    Activity Tolerance: Patient tolerated treatment well Patient left: in bed;with call bell/phone within reach  OT Visit Diagnosis: Muscle weakness (generalized) (M62.81)                Time: 1610-9604 OT Time Calculation (min): 14 min Charges:  OT General Charges $OT Visit: 1 Visit OT Evaluation $OT Eval Low Complexity: 1 Low G-Codes:     [place], OTR/L [phone] 07/14/2017  Kayd Launer 07/14/2017, 10:46 AM

## 2017-07-14 NOTE — Progress Notes (Signed)
PT Cancellation Note  Patient Details Name: Burnard LeighDior Bettenhausen MRN: [id number] DOB: Jan 15, 1971   Cancelled Treatment:    Reason Eval/Treat Not Completed: PT screened, no needs identified, will sign off(pt reports she ambulated 2 laps around unit (800') this morning and felt steady. She will have assistance from her aunt at home. Pt was encouraged to ambulate at least TID in halls. Will sign off as pt is independent with mobility per her report.)   Tamala SerUhlenberg, Kinslea Frances Kistler 07/14/2017, 10:21 AM [phone]

## 2017-07-15 NOTE — Progress Notes (Signed)
5 Days Post-Op   Subjective/Chief Complaint: Tolerating advance in her diet comfortable   Objective: Vital signs in last 24 hours: Temp:  [98.4 F (36.9 C)-98.7 F (37.1 C)] 98.7 F (37.1 C) (02/07 0600) Pulse Rate:  [75-86] 79 (02/07 0600) Resp:  [16-18] 18 (02/07 0600) BP: (124-147)/(69-85) 147/85 (02/07 0600) SpO2:  [100 %] 100 % (02/07 0600) Last BM Date: 07/14/17  Intake/Output from previous day: 02/06 0701 - 02/07 0700 In: 770 [P.O.:660; IV Piggyback:110] Out: -  Intake/Output this shift: Total I/O In: 120 [P.O.:120] Out: -   Exam: Comfortable appearing Abdomen soft, VAC in place  Lab Results:  Recent Labs    07/13/17 0537  WBC 13.7*  HGB 10.5*  HCT 33.2*  PLT 240   BMET Recent Labs    07/13/17 0537  NA 141  K 3.4*  CL 107  CO2 27  GLUCOSE 118*  BUN 14  CREATININE 0.52  CALCIUM 8.9   PT/INR No results for input(s): LABPROT, INR in the last 72 hours. ABG No results for input(s): PHART, HCO3 in the last 72 hours.  Invalid input(s): PCO2, PO2  Studies/Results: No results found.  Anti-infectives: Anti-infectives (From admission, onward)   Start     Dose/Rate Route Frequency Ordered Stop   07/10/17 1600  piperacillin-tazobactam (ZOSYN) IVPB 3.375 g     3.375 g 12.5 mL/hr over 240 Minutes Intravenous Every 8 hours 07/10/17 1544 07/15/17 0954   07/10/17 1030  piperacillin-tazobactam (ZOSYN) IVPB 3.375 g     3.375 g 100 mL/hr over 30 Minutes Intravenous  Once 07/10/17 1016 07/10/17 1100      Assessment/Plan: s/p Procedure(s): EXPLORATORY LAPAROTOMY REPAIR OF PERFORATED ULCER AT [facility] (N/A)  Advance to soft diet Continue VAC Potential home tomorrow  LOS: 5 days    Shaquinta Peruski A 07/15/2017

## 2017-07-16 MED ORDER — OXYCODONE HCL 5 MG PO TABS: 5.0000 mg | ORAL_TABLET | ORAL | 0 refills | Status: DC | PRN

## 2017-07-16 MED ORDER — OMEPRAZOLE 40 MG PO CPDR: 40.0000 mg | DELAYED_RELEASE_CAPSULE | Freq: Two times a day (BID) | ORAL | 1 refills | Status: DC

## 2017-07-16 MED ORDER — METHOCARBAMOL 500 MG PO TABS: 500.0000 mg | ORAL_TABLET | Freq: Four times a day (QID) | ORAL | 0 refills | Status: DC | PRN

## 2017-07-16 NOTE — Discharge Summary (Signed)
[facility] Discharge Summary   Patient ID: Sherri Gilbert MRN: [id number] DOB/AGE: May 18, 1971 47 y.o.  Admit date: 07/10/2017 Discharge date: 07/17/2017 Admitting Diagnosis: Perforated gastric ulcer secondary to NSAID use  Discharge Diagnosis Patient Active Problem List   Diagnosis Date Noted  . Perforated gastric ulcer s/p omental Cheree DittoGraham patch 07/10/2017 07/10/2017    Consultants WOC  Imaging: No results found.  Procedures Dr. Corliss Skainssuei (07/10/17) - Exploratory laparotomy with Cheree DittoGraham patch[facility]  Hospital Course:  Patient is a 47 y/[facility]  Workup showed perforated gastric ulcer.  Patient was admitted and underwent procedure listed above.  Tolerated procedure well and was transferred to the floor.  Diet was advanced as tolerated.  On POD#6, the patient was voiding well, tolerating diet, ambulating well, pain well controlled, vital signs stable, incisions c/d/i and felt stable for discharge home. Patient will discharge home with NPWT.   Patient will follow up in our office in 2 weeks and knows to call with questions or concerns.  She will call to confirm appointment date/time.    Physical Exam: General:  Alert, NAD, pleasant, comfortable Abd:  Soft, ND, mild tenderness, incisions C/D/I  I have personally looked this patient up in the [employer] Controlled Substance Database and reviewed their medications.  Allergies as of 07/17/2017      Reactions   Nsaids    perf ulcer on NSAIDs      Medication List    TAKE these medications   methocarbamol 500 MG tablet Commonly known as:  ROBAXIN Take 1 tablet (500 mg total) by mouth every 6 (six) hours as needed for muscle spasms.   omeprazole 40 MG capsule Commonly known as:  PRILOSEC Take 1 capsule (40 mg total) by mouth 2 (two) times daily.   oxyCODONE 5 MG immediate release tablet Commonly known as:  Oxy IR/ROXICODONE Take 1 tablet (5 mg total) by mouth every 4  (four) hours as needed for moderate pain or severe pain (5 mg for moderate; 10 mg for severe).        Follow-up Information    Manus Ruddsuei, Matthew, MD. Call.   Specialty:  General Surgery Why:  Call and confirm appointment date/time. Please arrive 30 min prior to appointment time. Bring photo ID and any insurance information.  Contact information: [address] [place] [address] [phone]        [facility]. Call.   Specialty:  Gastroenterology Why:  Call for an appointment in 6 weeks.  Contact information: [address] [place] [phone]       [facility] Follow up.   Specialty:  Home Health Services Contact information: [address] [place] [address] [phone]           Signed: Wells GuilesKelly Rayburn, [facility] 07/19/2017, 10:28 AM Pager: [phone] Consults: [phone] Mon-Fri 7:00 am-4:30 pm Sat-Sun 7:00 am-11:30 am

## 2017-07-16 NOTE — Progress Notes (Signed)
This CM was alerted by Wells GuilesKelly Rayburn PA that pt needed home wound vac. Wound Vac forms faxed to [facility](Tommy Aundria RudRogers [phone]) and awaiting charity approval as pt has no insurance. Pt [facility]. Awaiting Vac Delivery to pt room if [facility]. Sandford Crazeora Kalen Neidert RN,BSN,NCM [phone]

## 2017-07-16 NOTE — Discharge Instructions (Signed)
[facility]      [facility] [phone]  OPEN ABDOMINAL SURGERY: POST OP INSTRUCTIONS  Always review your discharge instruction sheet given to you by the facility where your surgery was performed.  IF YOU HAVE DISABILITY OR FAMILY LEAVE FORMS, YOU MUST BRING THEM TO THE OFFICE FOR PROCESSING.  PLEASE DO NOT GIVE THEM TO YOUR DOCTOR.  1. A prescription for pain medication may be given to you upon discharge.  Take your pain medication as prescribed, if needed.  If narcotic pain medicine is not needed, then you may take acetaminophen (Tylenol) or ibuprofen (Advil) as needed. 2. Take your usually prescribed medications unless otherwise directed. 3. If you need a refill on your pain medication, please contact your pharmacy. They will contact our office to request authorization.  Prescriptions will not be filled after 5pm or on week-ends. 4. You should follow a light diet the first few days after arrival home, such as soup and crackers, pudding, etc.unless your doctor has advised otherwise. A high-fiber, low fat diet can be resumed as tolerated.   Be sure to include lots of fluids daily. Most patients will experience some swelling and bruising on the chest and neck area.  Ice packs will help.  Swelling and bruising can take several days to resolve 5. Most patients will experience some swelling and bruising in the area of the incision. Ice pack will help. Swelling and bruising can take several days to resolve..  6. It is common to experience some constipation if taking pain medication after surgery.  Increasing fluid intake and taking a stool softener will usually help or prevent this problem from occurring.  A mild laxative (Milk of Magnesia or Miralax) should be taken according to package directions if there are no bowel movements after 48 hours. 7.  You may have steri-strips (small skin tapes) in place directly over the incision.  These strips should be left on the skin for 7-10 days.  If your  surgeon used skin glue on the incision, you may shower in 24 hours.  The glue will flake off over the next 2-3 weeks.  Any sutures or staples will be removed at the office during your follow-up visit. You may find that a light gauze bandage over your incision may keep your staples from being rubbed or pulled. You may shower and replace the bandage daily. 8. ACTIVITIES:  You may resume regular (light) daily activities beginning the next day--such as daily self-care, walking, climbing stairs--gradually increasing activities as tolerated.  You may have sexual intercourse when it is comfortable.  Refrain from any heavy lifting or straining until approved by your doctor. a. You may drive when you no longer are taking prescription pain medication, you can comfortably wear a seatbelt, and you can safely maneuver your car and apply brakes b. Return to Work: ___________________________________ 9. You should see your doctor in the office for a follow-up appointment approximately two weeks after your surgery.  Make sure that you call for this appointment within a day or two after you arrive home to insure a convenient appointment time.   WHEN TO CALL YOUR DOCTOR: 1. Fever over 101.0 2. Inability to urinate 3. Nausea and/or vomiting 4. Extreme swelling or bruising 5. Continued bleeding from incision. 6. Increased pain, redness, or drainage from the incision. 7. Difficulty swallowing or breathing 8. Muscle cramping or spasms. 9. Numbness or tingling in hands or feet or around lips.  The clinic staff is available to answer your questions during   regular business hours.  Please don't hesitate to call and ask to speak to one of the nurses if you have concerns.  For further questions, please visit [email or web address]   

## 2017-07-16 NOTE — Progress Notes (Signed)
Still waiting on home Vac approval and [facility]. Rep Orvilla Fusommy to call nursing staff directly if decision made after 5pm. Sandford Crazeora Aldo Sondgeroth RN,BSN,NCM [phone]

## 2017-07-16 NOTE — Progress Notes (Signed)
[facility] Progress Note  6 Days Post-Op  Subjective: CC: abdominal pain Mild abdominal pain, improved with medication. Denies nausea/vomiting. Tolerating diet. UOP good. VSS.   Objective: Vital signs in last 24 hours: Temp:  [98.2 F (36.8 C)-98.8 F (37.1 C)] 98.8 F (37.1 C) (02/08 1415) Pulse Rate:  [71-75] 71 (02/08 1415) Resp:  [16-18] 16 (02/08 1415) BP: (112-124)/(57-74) 112/57 (02/08 1415) SpO2:  [99 %-100 %] 100 % (02/08 1415) Last BM Date: 07/16/17  Intake/Output from previous day: 02/07 0701 - 02/08 0700 In: 1020 [P.O.:840; IV Piggyback:180] Out: -  Intake/Output this shift: Total I/O In: 50 [IV Piggyback:50] Out: 600 [Urine:600]  PE: Gen:  Alert, NAD, pleasant Card:  Regular rate and rhythm, pedal pulses 2+ BL Pulm:  Normal effort, clear to auscultation bilaterally Abd: Soft, mildly tender, non-distended, bowel sounds present, no HSM, incision C/D/I with VAC present Skin: warm and dry, no rashes  Psych: A&Ox3   Lab Results:  No results for input(s): WBC, HGB, HCT, PLT in the last 72 hours. BMET No results for input(s): NA, K, CL, CO2, GLUCOSE, BUN, CREATININE, CALCIUM in the last 72 hours. PT/INR No results for input(s): LABPROT, INR in the last 72 hours. CMP     Component Value Date/Time   NA 141 07/13/2017 0537   K 3.4 (L) 07/13/2017 0537   CL 107 07/13/2017 0537   CO2 27 07/13/2017 0537   GLUCOSE 118 (H) 07/13/2017 0537   BUN 14 07/13/2017 0537   CREATININE 0.52 07/13/2017 0537   CALCIUM 8.9 07/13/2017 0537   PROT 7.2 07/10/2017 0738   ALBUMIN 4.1 07/10/2017 0738   AST 17 07/10/2017 0738   ALT 11 (L) 07/10/2017 0738   ALKPHOS 56 07/10/2017 0738   BILITOT 0.4 07/10/2017 0738   GFRNONAA >60 07/13/2017 0537   GFRAA >60 07/13/2017 0537   Lipase  No results found for: LIPASE     Studies/Results: No results found.  Anti-infectives: Anti-infectives (From admission, onward)   Start     Dose/Rate Route Frequency Ordered  Stop   07/10/17 1600  piperacillin-tazobactam (ZOSYN) IVPB 3.375 g     3.375 g 12.5 mL/hr over 240 Minutes Intravenous Every 8 hours 07/10/17 1544 07/15/17 0954   07/10/17 1030  piperacillin-tazobactam (ZOSYN) IVPB 3.375 g     3.375 g 100 mL/hr over 30 Minutes Intravenous  Once 07/10/17 1016 07/10/17 1100       Assessment/Plan NSAID use History of asthma Hypokalemia- replace potassium  Perforated prepyloric ulcer Exploratory laparotomy with Cheree DittoGraham patch of perforated prepyloric ulcer/placement of subcutaneous wound VAC(2 x 15 cm), 07/10/17 Dr. Manus RuddMatthew Tsuei. - POD#6 - mobilize, OOB as tolerated - patient getting home VAC delivered tonight, but did not have family support at home for tonight  FEN: SOFT ID: Zosyn 07/10/17>07/15/17 DVT: Lovenox Foley: None Follow-up: Dr. Manus RuddMatthew Tsuei  Patient to be discharged to home tomorrow AM. Prescriptions signed an on chart. Summary pended.    LOS: 6 days    Wells GuilesKelly Rayburn , [facility] 07/16/2017, 4:59 PM Pager: [phone] Consults: [phone] Mon-Fri 7:00 am-4:30 pm Sat-Sun 7:00 am-11:30 am

## 2017-07-17 LAB — CREATININE, SERUM
CREATININE: 0.54 mg/dL (ref 0.44–1.00)
GFR calc Af Amer: >60 mL/min (ref >60)

## 2017-07-17 MED ORDER — LIP MEDEX EX OINT
TOPICAL_OINTMENT | CUTANEOUS | Status: AC
  Administered 2017-07-17: 10:00:00
  Filled 2017-07-17: qty 7

## 2017-07-17 NOTE — Progress Notes (Signed)
Patient looks great.  Has VAC equipment in room.  O,ay to go home.  Marta LamasJames O. Gae BonWyatt, III, MD, FACS [phone]--pager [phone]--office [facility]

## 2017-07-17 NOTE — Progress Notes (Signed)
Discharge instructions completed, IV removed intact, Wound vac connected for discharge and wound vac supplies provided for homecare. Prescriptions given for discharge. AJoones

## 2017-07-22 ENCOUNTER — Encounter: Payer: Self-pay | Admitting: Nurse Practitioner

## 2017-08-09 ENCOUNTER — Ambulatory Visit (INDEPENDENT_AMBULATORY_CARE_PROVIDER_SITE_OTHER): Payer: BLUE CROSS/BLUE SHIELD | Admitting: Nurse Practitioner

## 2017-08-09 ENCOUNTER — Encounter: Payer: Self-pay | Admitting: Nurse Practitioner

## 2017-08-09 VITALS — BP 130/70 | HR 68 | Temp 98.1°F | Ht 66.0 in | Wt 124.0 lb

## 2017-08-09 DIAGNOSIS — K279 Peptic ulcer, site unspecified, unspecified as acute or chronic, without hemorrhage or perforation: Secondary | ICD-10-CM

## 2017-08-09 NOTE — Progress Notes (Addendum)
Chief Complaint:  Perforated ulcer   Referring Provider: Manus Rudd , MD     ASSESSMENT AND PLAN;   1. 47 yo female admitted 07/10/17 with perforated gastric ulcer in setting of heavy NSAID use, s/p exploratory laparotomy with Cheree Ditto patch, VAC placement. Wound Vac removed last week.  -Patient needs EGD to assess for ulcer healing / exclude malignancy.  This will be scheduled for 08/30/17. The risks and benefits of EGD were discussed and the patient agrees to proceed.  -continue BID PPI for now.  -Avoid NSAIDS  2. H.pylori IgG positivity. Patient doesn't recall ever being treated.  -We can biopsy at time of EGD and treat if positive.  3. Colon cancer screening. She is age for screening which starts earlier for African Americans.  -She would like to hold off on colonoscopy for now.    Addendum: Reviewed and agree with initial management. Pyrtle, Carie Caddy, MD    HPI:    Is a 47 year old female referred by [facility] surgery for EGD.  On 07/10/2017 patient presented to the emergency department with severe abdominal pain.  Found to have a large amount of free fluid as well as free abdomen consistent with a perforated viscus.  She was taken to the OR for exploratory laparotomy with [facility] patch and placement of wound vac. She had a hospital follow up with [facility] last week, wound vac was removed. Deyona complains of "soreness" in abdomen when twisting or moving about. She is no longer taking NSAIDs, taking BID PPI. No nausea or vomiting. BMs normal.  No FMH of colon cancer   Past Medical History:  Diagnosis Date  . Asthma   . Peptic ulcer     Past Surgical History:  Procedure Laterality Date  . LAPAROTOMY N/A 07/10/2017   Procedure: EXPLORATORY LAPAROTOMY REPAIR OF PERFORATED ULCER AT PYLORUS;  Surgeon: Manus Rudd, MD;  Location: [facility];  Service: General;  Laterality: N/A;   Family History  Problem Relation Age of Onset  . Ulcers Father   . Colon cancer Neg Hx   .  Esophageal cancer Neg Hx   . Rectal cancer Neg Hx    Social History   Tobacco Use  . Smoking status: Former Smoker    Packs/day: 0.50    Types: Cigarettes    Last attempt to quit: 06/08/2017    Years since quitting: 0.1  . Smokeless tobacco: Never Used  Substance Use Topics  . Alcohol use: No    Frequency: Never  . Drug use: No   Current Outpatient Medications  Medication Sig Dispense Refill  . methocarbamol (ROBAXIN) 500 MG tablet Take 1 tablet (500 mg total) by mouth every 6 (six) hours as needed for muscle spasms. 60 tablet 0  . omeprazole (PRILOSEC) 40 MG capsule Take 1 capsule (40 mg total) by mouth 2 (two) times daily. 60 capsule 1   No current facility-administered medications for this visit.    Allergies  Allergen Reactions  . Nsaids     perf ulcer on NSAIDs     Review of Systems: All systems reviewed and negative except where noted in HPI.    Physical Exam:    BP 130/70   Pulse 68   Temp 98.1 F (36.7 C)   Ht 5\' 6"  (1.676 m)   Wt 124 lb (56.2 kg)   BMI 20.01 kg/m  Constitutional:  Thin black female in no acute distress. Psychiatric: Normal mood and affect. Behavior is normal. EENT: Pupils normal.  Conjunctivae  are normal. No scleral icterus. Neck supple.  Cardiovascular: Normal rate, regular rhythm. No edema Pulmonary/chest: Effort normal and breath sounds normal. No wheezing, rales or rhonchi. Abdominal: Soft, nondistended. Nontender. Bowel sounds active throughout. There are no masses palpable. No hepatomegaly. Neurological: Alert and oriented to person place and time. Skin: Skin is warm and dry. No rashes noted.  Willette ClusterPaula Kavi Almquist, NP  08/09/2017, 11:11 AM  Cc: Manus Ruddsuei, Matthew, MD

## 2017-08-09 NOTE — Patient Instructions (Addendum)
If you are age 48 or older, your body mass index should be between 23-30. Your Body mass index is 20.01 kg/m. If this is out of the aforementioned range listed, please consider follow up with your Primary Care Provider.  If you are age 48 or younger, your body mass index should be between 19-25. Your Body mass index is 20.01 kg/m. If this is out of the aformentioned range listed, please consider follow up with your Primary Care Provider.   You have been scheduled for an endoscopy. Please follow written instructions given to you at your visit today. If you use inhalers (even only as needed), please bring them with you on the day of your procedure. Your physician has requested that you go to www.startemmi.com and enter the access code given to you at your visit today. This web site gives a general overview about your procedure. However, you should still follow specific instructions given to you by our office regarding your preparation for the procedure.  Thank you for choosing me and [facility].   Sherri ClusterPaula Guenther, NP

## 2017-08-10 ENCOUNTER — Encounter: Payer: Self-pay | Admitting: Nurse Practitioner

## 2017-08-16 ENCOUNTER — Encounter: Payer: Self-pay | Admitting: Internal Medicine

## 2017-08-27 ENCOUNTER — Telehealth: Payer: Self-pay | Admitting: Internal Medicine

## 2017-08-30 ENCOUNTER — Encounter: Payer: BLUE CROSS/BLUE SHIELD | Admitting: Internal Medicine

## 2018-07-03 ENCOUNTER — Emergency Department (HOSPITAL_COMMUNITY)
Admission: EM | Admit: 2018-07-03 | Discharge: 2018-07-03 | Disposition: A | Discharge status: Home / Self Care | Payer: BLUE CROSS/BLUE SHIELD | Attending: Emergency Medicine | Admitting: Emergency Medicine

## 2018-07-03 ENCOUNTER — Encounter (HOSPITAL_COMMUNITY): Payer: Self-pay

## 2018-07-03 DIAGNOSIS — J45909 Unspecified asthma, uncomplicated: Secondary | ICD-10-CM | POA: Insufficient documentation

## 2018-07-03 DIAGNOSIS — Z87891 Personal history of nicotine dependence: Secondary | ICD-10-CM | POA: Insufficient documentation

## 2018-07-03 DIAGNOSIS — I10 Essential (primary) hypertension: Secondary | ICD-10-CM | POA: Insufficient documentation

## 2018-07-03 DIAGNOSIS — K0889 Other specified disorders of teeth and supporting structures: Secondary | ICD-10-CM | POA: Insufficient documentation

## 2018-07-03 HISTORY — DX: Essential (primary) hypertension: I10

## 2018-07-03 MED ORDER — HYDROCODONE-ACETAMINOPHEN 5-325 MG PO TABS: 1.0000 | ORAL_TABLET | Freq: Four times a day (QID) | ORAL | 0 refills | Status: DC | PRN

## 2018-07-03 MED ORDER — PENICILLIN V POTASSIUM 500 MG PO TABS: 500.0000 mg | ORAL_TABLET | Freq: Three times a day (TID) | ORAL | 0 refills | Status: AC

## 2018-07-03 NOTE — ED Triage Notes (Signed)
Onset 3 days left upper tooth pain.

## 2018-07-03 NOTE — ED Provider Notes (Signed)
[facility] EMERGENCY DEPARTMENT Provider Note   CSN: [id number] Arrival date & time: 07/03/18  1304   History   Chief Complaint Chief Complaint  Patient presents with  . Dental Pain    HPI Nahla Vicary is a 48 y.o. female with past medical history significant for perforated peptic ulcer, asthma, hypertension who presents for evaluation of dental pain.  Patient states she has had pain located to her left upper tooth x3 days.  Patient states she does have history of periapical abscess and she is concerned this may be developing one.  Has taken Tylenol without relief of her pain.  Her pain is rated a 7/10.  Pain does not radiate.  Patient states she does have a dentist that she normally follows with, however was not able to get an appointment on Friday.  Denies fever, chills, nausea, vomiting, drooling, trismus, neck pain, neck stiffness, facial swelling.  Denies additional aggravating or alleviating factors. Patient cannot take NSAIDs secondary to recent perforated ulcer.  History obtained from patient.  No interpreter was used.  HPI  Past Medical History:  Diagnosis Date  . Asthma   . Hypertension   . Peptic ulcer     Patient Active Problem List   Diagnosis Date Noted  . Perforated gastric ulcer s/p omental Cheree Ditto patch 07/10/2017 07/10/2017    Past Surgical History:  Procedure Laterality Date  . LAPAROTOMY N/A 07/10/2017   Procedure: EXPLORATORY LAPAROTOMY REPAIR OF PERFORATED ULCER AT PYLORUS;  Surgeon: Manus Rudd, MD;  Location: [facility];  Service: General;  Laterality: N/A;     OB History   No obstetric history on file.      Home Medications    Prior to Admission medications   Medication Sig Start Date End Date Taking? Authorizing Provider  HYDROcodone-acetaminophen (NORCO/VICODIN) 5-325 MG tablet Take 1 tablet by mouth every 6 (six) hours as needed for severe pain. 07/03/18   Henderly, Britni A, PA-C  methocarbamol (ROBAXIN) 500 MG tablet Take 1  tablet (500 mg total) by mouth every 6 (six) hours as needed for muscle spasms. 07/16/17   Rayburn, Alphonsus Sias, PA-C  omeprazole (PRILOSEC) 40 MG capsule Take 1 capsule (40 mg total) by mouth 2 (two) times daily. 07/16/17 08/27/17  Rayburn, Tresa Endo A, PA-C  penicillin v potassium (VEETID) 500 MG tablet Take 1 tablet (500 mg total) by mouth 3 (three) times daily for 5 days. 07/03/18 07/08/18  Henderly, Britni A, PA-C    Family History Family History  Problem Relation Age of Onset  . Ulcers Father   . Colon cancer Neg Hx   . Esophageal cancer Neg Hx   . Rectal cancer Neg Hx     Social History Social History   Tobacco Use  . Smoking status: Former Smoker    Packs/day: 0.50    Types: Cigarettes    Last attempt to quit: 06/08/2017    Years since quitting: 1.0  . Smokeless tobacco: Never Used  Substance Use Topics  . Alcohol use: No    Frequency: Never  . Drug use: No     Allergies   Nsaids   Review of Systems Review of Systems  Constitutional: Negative.   HENT: Positive for dental problem. Negative for congestion, drooling, ear discharge, ear pain, facial swelling, hearing loss, mouth sores, nosebleeds, postnasal drip, rhinorrhea, sinus pressure, sinus pain, sneezing, sore throat, tinnitus, trouble swallowing and voice change.   Respiratory: Negative.   Cardiovascular: Negative.   Gastrointestinal: Negative.   Genitourinary: Negative.  Musculoskeletal: Negative.   All other systems reviewed and are negative.    Physical Exam Updated Vital Signs BP (!) 156/97 (BP Location: Right Arm)   Pulse (!) 105   Temp 98.8 F (37.1 C) (Oral) Comment: Simultaneous filing. User may not have seen previous data. Comment (Src): Simultaneous filing. User may not have seen previous data.  Resp 17   LMP 06/12/2018   SpO2 100%   Physical Exam Vitals signs and nursing note reviewed.  Constitutional:      General: She is not in acute distress.    Appearance: She is well-developed. She is not  ill-appearing, toxic-appearing or diaphoretic.  HENT:     Head: Normocephalic and atraumatic.     Jaw: There is normal jaw occlusion.     Comments: No facial swelling or tenderness palpation over jaw.    Nose: Nose normal.     Right Sinus: No maxillary sinus tenderness or frontal sinus tenderness.     Left Sinus: No maxillary sinus tenderness or frontal sinus tenderness.     Mouth/Throat:     Lips: Pink. No lesions.     Mouth: Mucous membranes are moist.     Dentition: Abnormal dentition. Dental tenderness and dental caries present. No dental abscesses or gum lesions.     Pharynx: Oropharynx is clear. Uvula midline.      Comments: Poserior oropharynx without erythema or edema.  Uvula midline without deviation.  Tonsils without edema or exudate.  Patient does have multiple dental caries.  No evidence of periapical abscess.  She does have mild gingival erythema without swelling surrounding tooth # 14 with tenderness to palpation.  Fractured molars on bilateral upper and lower dentition. Eyes:     Pupils: Pupils are equal, round, and reactive to light.  Neck:     Musculoskeletal: Normal range of motion.     Comments: No neck stiffness or neck rigidity. Cardiovascular:     Rate and Rhythm: Normal rate.  Pulmonary:     Effort: No respiratory distress.  Abdominal:     General: There is no distension.  Musculoskeletal: Normal range of motion.  Skin:    General: Skin is warm and dry.  Neurological:     Mental Status: She is alert.      ED Treatments / Results  Labs (all labs ordered are listed, but only abnormal results are displayed) Labs Reviewed - No data to display  EKG None  Radiology No results found.  Procedures Procedures (including critical care time)  Medications Ordered in ED Medications - No data to display   Initial Impression / Assessment and Plan / ED Course  I have reviewed the triage vital signs and the nursing notes.  Pertinent labs & imaging results  that were available during my care of the patient were reviewed by me and considered in my medical decision making (see chart for details).  48 year old female who appears otherwise well presents for evaluation of dental pain.  Onset 3 days ago.  Afebrile, nonseptic, non-ill-appearing.  Patient has a history of periapical abscess.  Patient with tenderness as well as gingival erythema surrounding tooth #14.  Overall poor dentition with multiple dental caries and fractured teeth.  No evidence of drainable periapical abscess.  Will place patient on antibiotics as well as pain medication and have her follow-up with dentistry.  Able to tolerate NSAIDs secondary to recent perforated gastric ulcer. Exam unconcerning for Ludwig's angina or spread of infection.   Patient is hemodynamically stable and appropriate for  DC home at this time.  She has had elevation of blood pressure in the department.  She does have history of hypertension.  She is asymptomatic without headache, vision changes, nausea, vomiting, chest pain, shortness of breath, abdominal pain.  Discussed follow-up with PCP for reevaluation of her blood pressure.  Discussed strict return precautions with patient.  Patient voiced understanding and is agreeable for follow-up.   Final Clinical Impressions(s) / ED Diagnoses   Final diagnoses:  Pain, dental    ED Discharge Orders         Ordered    penicillin v potassium (VEETID) 500 MG tablet  3 times daily     07/03/18 1358    HYDROcodone-acetaminophen (NORCO/VICODIN) 5-325 MG tablet  Every 6 hours PRN     07/03/18 1358           Henderly, Britni A, PA-C 07/03/18 1422    Margarita Grizzle, MD 07/07/18 1845

## 2018-07-03 NOTE — Discharge Instructions (Signed)
You were evaluated today for dental pain.  There is no evidence of abscess to drain.  I have placed you on antibiotics and given you pain medication.  Please do not drive or operate heavy machinery while taking this pain medication.  Please take as prescribed.  Your blood pressure was elevated in the department.  Please follow-up with your primary care provider for reevaluation.  Return to the ED for any worsening symptoms.

## 2018-07-03 NOTE — ED Notes (Signed)
Patient verbalizes understanding of discharge instructions. Opportunity for questioning and answers were provided. Armband removed by staff, pt discharged from ED ambulatory.   

## 2019-07-19 ENCOUNTER — Emergency Department (HOSPITAL_COMMUNITY)
Admission: EM | Admit: 2019-07-19 | Discharge: 2019-07-19 | Disposition: A | Discharge status: Home / Self Care | Payer: Self-pay | Attending: Emergency Medicine | Admitting: Emergency Medicine

## 2019-07-19 ENCOUNTER — Encounter (HOSPITAL_COMMUNITY): Payer: Self-pay | Admitting: Emergency Medicine

## 2019-07-19 DIAGNOSIS — J45909 Unspecified asthma, uncomplicated: Secondary | ICD-10-CM | POA: Insufficient documentation

## 2019-07-19 DIAGNOSIS — Y9389 Activity, other specified: Secondary | ICD-10-CM | POA: Insufficient documentation

## 2019-07-19 DIAGNOSIS — R0789 Other chest pain: Secondary | ICD-10-CM | POA: Insufficient documentation

## 2019-07-19 DIAGNOSIS — Y999 Unspecified external cause status: Secondary | ICD-10-CM | POA: Insufficient documentation

## 2019-07-19 DIAGNOSIS — X58XXXA Exposure to other specified factors, initial encounter: Secondary | ICD-10-CM | POA: Insufficient documentation

## 2019-07-19 DIAGNOSIS — I10 Essential (primary) hypertension: Secondary | ICD-10-CM | POA: Insufficient documentation

## 2019-07-19 DIAGNOSIS — R0781 Pleurodynia: Secondary | ICD-10-CM

## 2019-07-19 DIAGNOSIS — Y929 Unspecified place or not applicable: Secondary | ICD-10-CM | POA: Insufficient documentation

## 2019-07-19 MED ORDER — METHOCARBAMOL 500 MG PO TABS: 500.0000 mg | ORAL_TABLET | Freq: Two times a day (BID) | ORAL | 0 refills | Status: DC

## 2019-07-19 NOTE — Discharge Instructions (Addendum)
Please stretch, soak in warm epsom salts and do gentle exercise. Take plenty of deep breaths each hour to fully expand young lungs  Please use 1000 mg of Tylenol every 6 hours for pain.  You may choose to alternate between this and robaxin.  This would be most effective.  Not to exceed 4 g of Tylenol within 24 hours.
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
## 2020-01-26 IMAGING — CR DG UGI W/ GASTROGRAFIN
10 of 12 series · 13 of 24 positions shown · IV contrast (isovue)
Comparison: CT 07/10/2017

CLINICAL DATA: Perforated ulcer

EXAM:
WATER SOLUBLE UPPER GI SERIES
TECHNIQUE: Single-column upper GI series was performed using water soluble
contrast.
CONTRAST:  50 mL Isovue 300

[t abdomen supine]
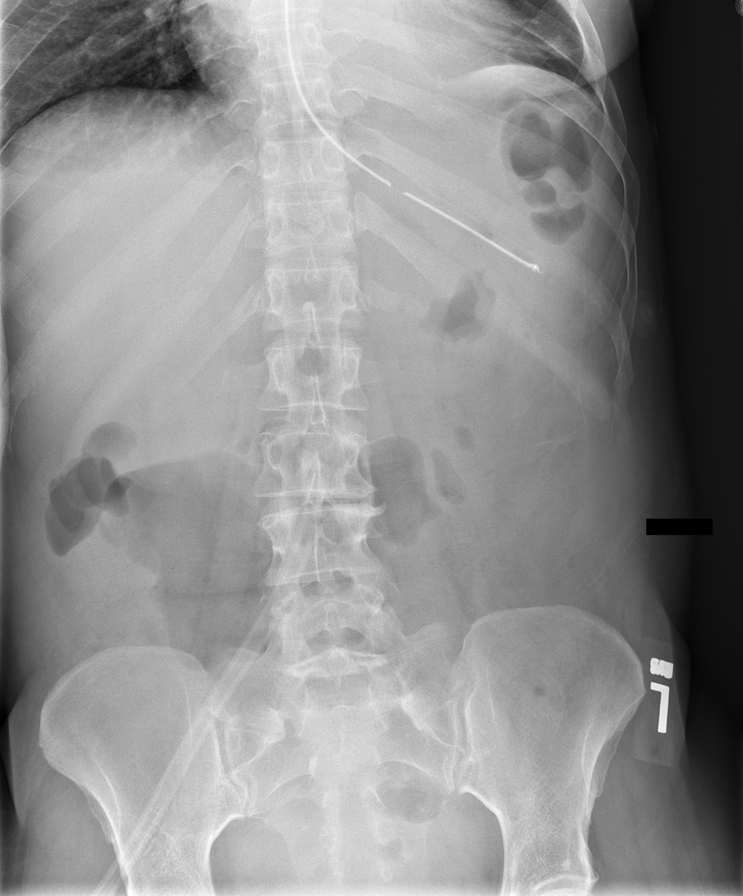

[cp_standard (1 of 9) · tomo slice 249/309.0]
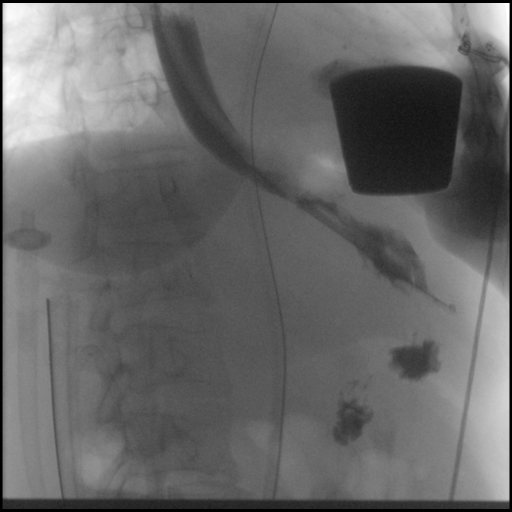

[Series 3: cp_standard · 0.36mm/px · 2 of 64 frames shown (2 of 9)]
[frame 10/64]
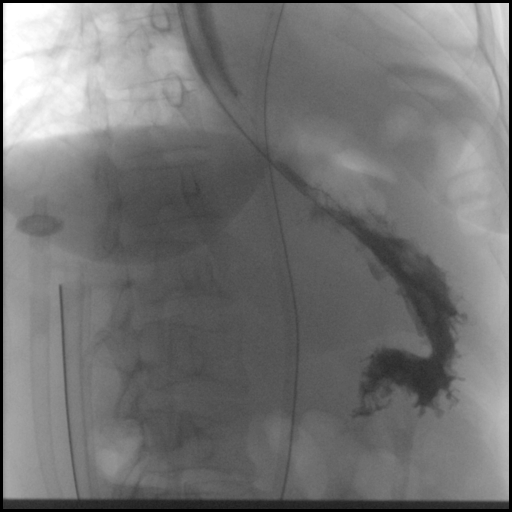
[frame 55/64]
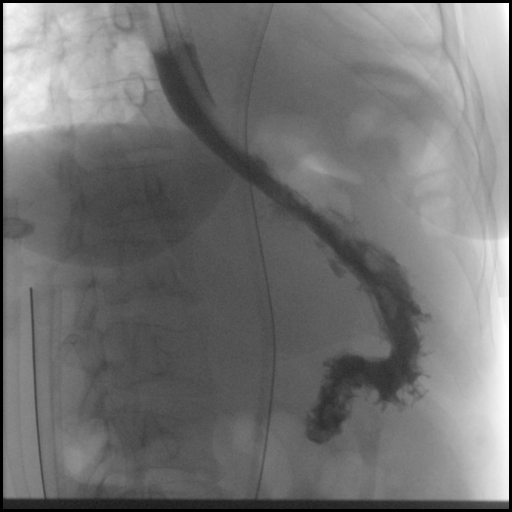

[cp_standard (3 of 9) · tomo slice 21/24.0]
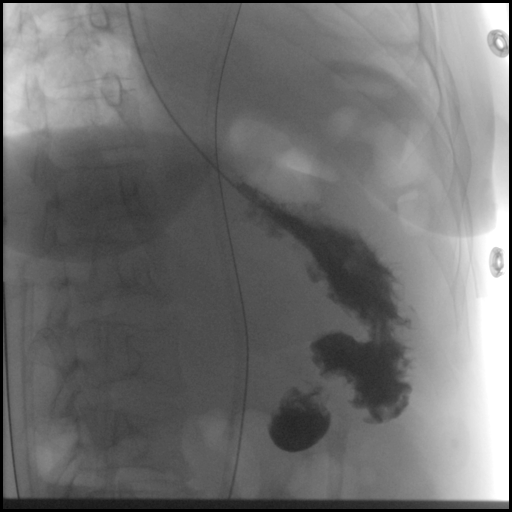

[Series 5: cp_standard · 0.36mm/px · 2 of 229 frames shown (4 of 9)]
[frame 35/229]
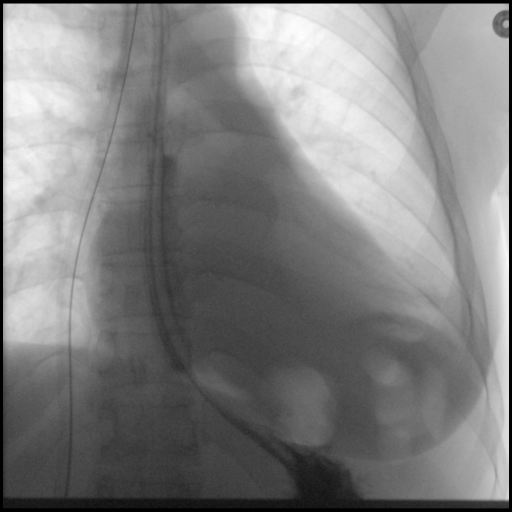
[frame 195/229]
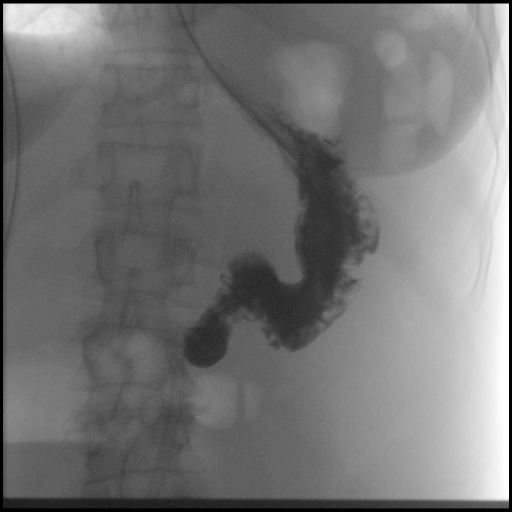

[Series 7: cp_standard · 0.36mm/px · 2 of 112 frames shown (5 of 9)]
[frame 14/112]
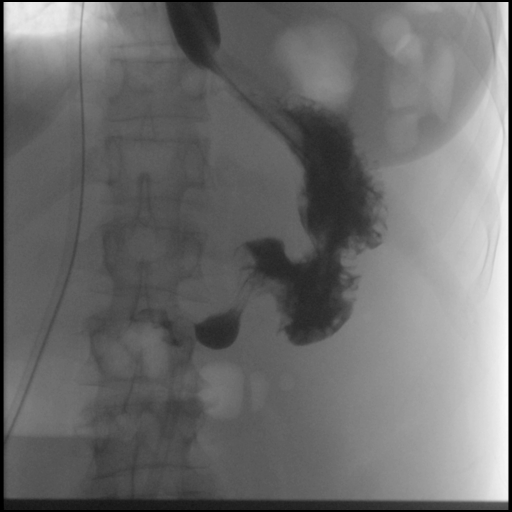
[frame 57/112]
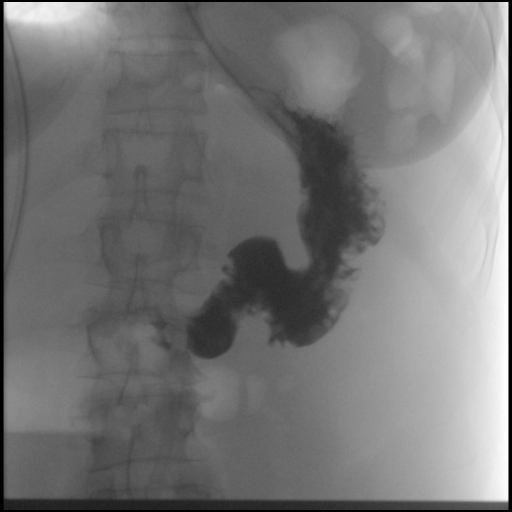

[cp_standard (6 of 9)]
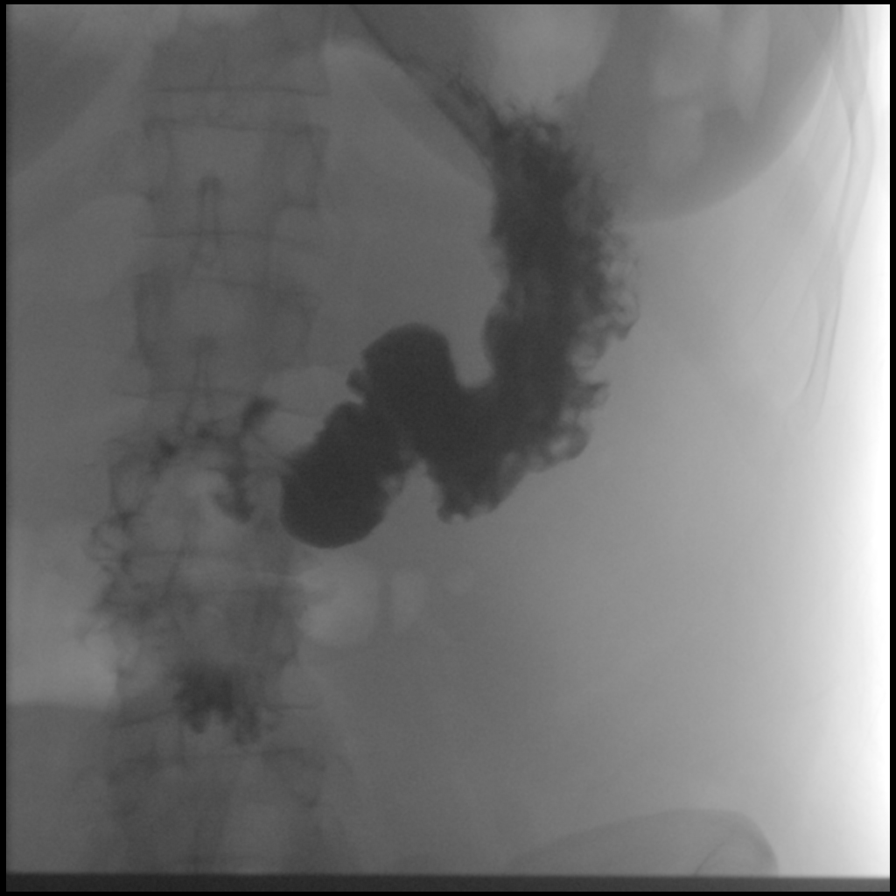

[cp_standard (7 of 9)]
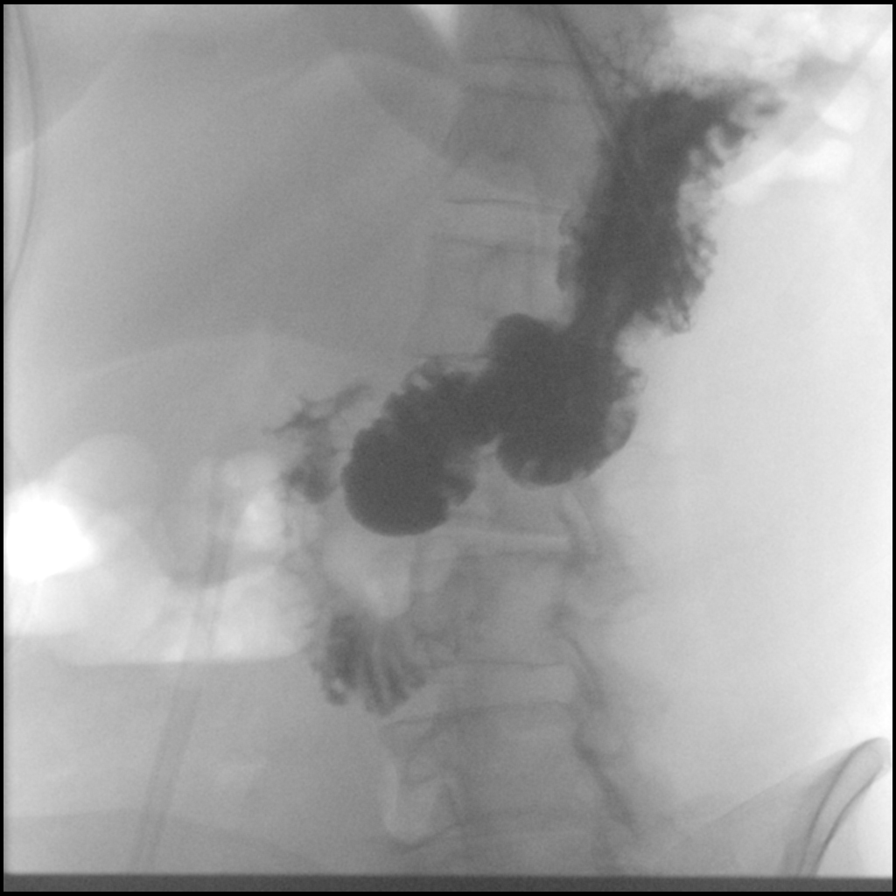

[cp_standard (8 of 9) · tomo slice 33/64.0]
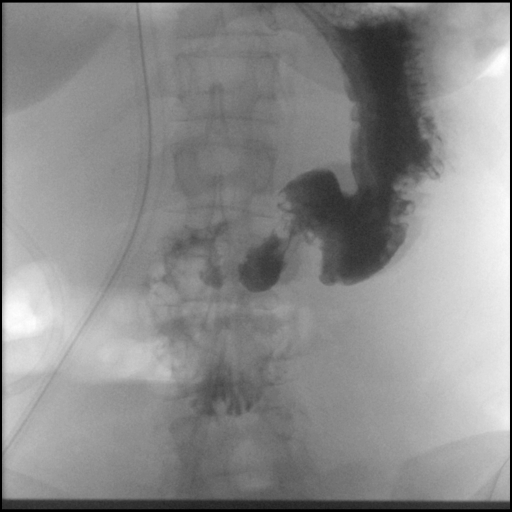

[cp_standard (9 of 9)]
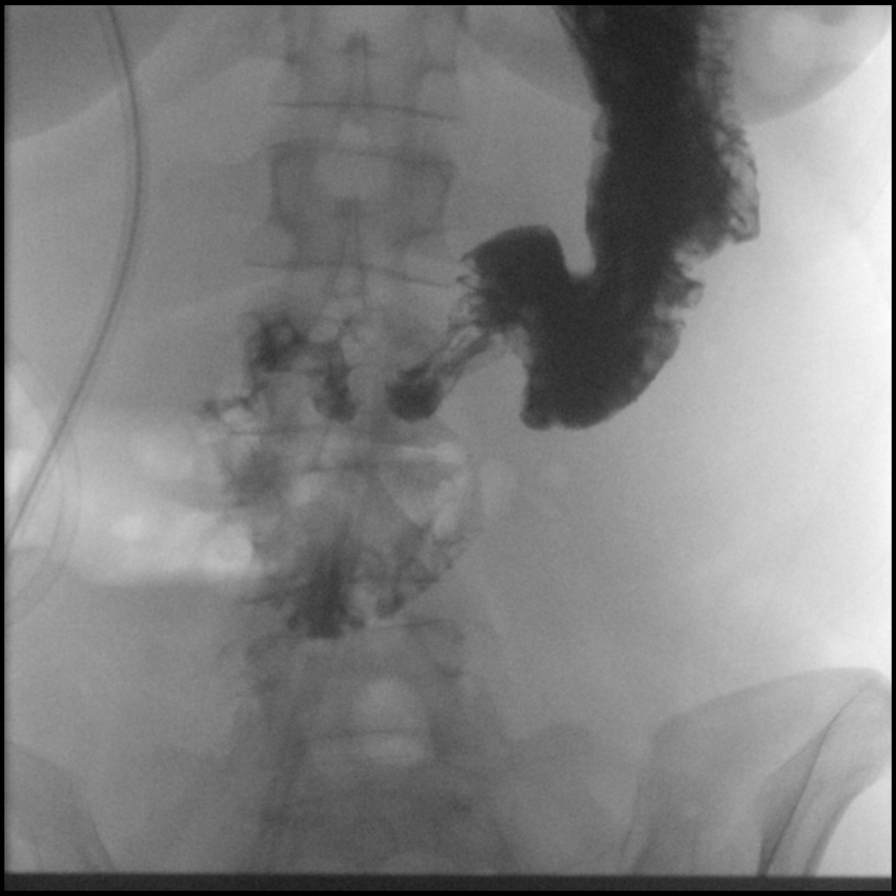

[13 of 24 positions shown; findings below may reference images not displayed]

FLUOROSCOPY TIME:  Fluoroscopy Time:  2.1 min

Radiation Exposure Index (if provided by the fluoroscopic device):
33.3 mGy

Number of Acquired Spot Images: 0
FINDINGS: Scout film demonstrates no free air. NG tube is in the stomach.
Nonobstructive bowel gas pattern.

Water-soluble upper GI was performed. This shows normal appearance
of the esophagus. Slow emptying of the stomach, likely related to
some degree of postoperative gastroparesis. No visible leak or
extravasation of contrast. Duodenal sweep grossly unremarkable.
IMPRESSION: No evidence of leak from the stomach or duodenum.

## 2020-06-16 ENCOUNTER — Emergency Department (HOSPITAL_COMMUNITY): Payer: Self-pay

## 2020-06-16 ENCOUNTER — Emergency Department (HOSPITAL_COMMUNITY)
Admission: EM | Admit: 2020-06-16 | Discharge: 2020-06-16 | Disposition: A | Discharge status: Home / Self Care | Payer: Self-pay | Attending: Emergency Medicine | Admitting: Emergency Medicine

## 2020-06-16 ENCOUNTER — Encounter (HOSPITAL_COMMUNITY): Payer: Self-pay

## 2020-06-16 DIAGNOSIS — I10 Essential (primary) hypertension: Secondary | ICD-10-CM | POA: Insufficient documentation

## 2020-06-16 DIAGNOSIS — Z87891 Personal history of nicotine dependence: Secondary | ICD-10-CM | POA: Insufficient documentation

## 2020-06-16 DIAGNOSIS — M62838 Other muscle spasm: Secondary | ICD-10-CM | POA: Insufficient documentation

## 2020-06-16 DIAGNOSIS — R079 Chest pain, unspecified: Secondary | ICD-10-CM

## 2020-06-16 DIAGNOSIS — J45909 Unspecified asthma, uncomplicated: Secondary | ICD-10-CM | POA: Insufficient documentation

## 2020-06-16 DIAGNOSIS — R3121 Asymptomatic microscopic hematuria: Secondary | ICD-10-CM | POA: Insufficient documentation

## 2020-06-16 LAB — URINALYSIS, ROUTINE W REFLEX MICROSCOPIC
Bilirubin Urine: NEGATIVE
Glucose, UA: NEGATIVE mg/dL
Ketones, ur: NEGATIVE mg/dL
Nitrite: NEGATIVE
Protein, ur: NEGATIVE mg/dL
Specific Gravity, Urine: 1.018 (ref 1.005–1.030)
pH: 5 (ref 5.0–8.0)

## 2020-06-16 LAB — COMPREHENSIVE METABOLIC PANEL
ALT: 14 U/L (ref 0–44)
AST: 19 U/L (ref 15–41)
Albumin: 4 g/dL (ref 3.5–5.0)
Alkaline Phosphatase: 76 U/L (ref 38–126)
Anion gap: 12 (ref 5–15)
BUN: 7 mg/dL (ref 6–20)
CO2: 20 mmol/L — ABNORMAL LOW (ref 22–32)
Calcium: 9.3 mg/dL (ref 8.9–10.3)
Chloride: 104 mmol/L (ref 98–111)
Creatinine, Ser: 0.75 mg/dL (ref 0.44–1.00)
GFR, Estimated: >60 mL/min (ref >60)
Glucose, Bld: 107 mg/dL — ABNORMAL HIGH (ref 70–99)
Potassium: 3.4 mmol/L — ABNORMAL LOW (ref 3.5–5.1)
Sodium: 136 mmol/L (ref 135–145)
Total Bilirubin: 0.6 mg/dL (ref 0.3–1.2)
Total Protein: 7.2 g/dL (ref 6.5–8.1)

## 2020-06-16 LAB — CBC
HCT: 45.5 % (ref 36.0–46.0)
Hemoglobin: 14.1 g/dL (ref 12.0–15.0)
MCH: 27.4 pg (ref 26.0–34.0)
MCHC: 31 g/dL (ref 30.0–36.0)
MCV: 88.3 fL (ref 80.0–100.0)
Platelets: 241 10*3/uL (ref 150–400)
RBC: 5.15 MIL/uL — ABNORMAL HIGH (ref 3.87–5.11)
RDW: 14.7 % (ref 11.5–15.5)
WBC: 5.6 10*3/uL (ref 4.0–10.5)
nRBC: 0 % (ref 0.0–0.2)

## 2020-06-16 MED ORDER — ACETAMINOPHEN ER 650 MG PO TBCR: 650.0000 mg | EXTENDED_RELEASE_TABLET | Freq: Three times a day (TID) | ORAL | 0 refills | Status: DC | PRN

## 2020-06-16 MED ORDER — CYCLOBENZAPRINE HCL 10 MG PO TABS: 10.0000 mg | ORAL_TABLET | Freq: Two times a day (BID) | ORAL | 0 refills | Status: DC | PRN

## 2020-06-16 NOTE — Discharge Instructions (Addendum)
You are seen in the ER for back pain.  The results are reassuring.  X-ray is not showing anything concerning, but it is advisable that he get a repeat x-ray within the next 3 months to ensure normalcy of your chest.  The urine analysis did show a low bit of blood.  We are not sure what to make of it.  If you start having burning with urination, fevers, chills, severe nausea and vomiting, or grossly bloody urine-return to the ER.  Otherwise your primary care doctor will have to repeat your urine to make sure the blood has cleared.  Providers a phone number for community wellness.  Please call them for earliest appointment

## 2020-06-16 NOTE — ED Notes (Signed)
Called patient to recheck V/S, unable to locate at this time. 

## 2020-06-16 NOTE — ED Triage Notes (Addendum)
Pt reports for the past 2 days she started having flank pain & lower thoracic pain. Pt reports last time she felt like this she had pneumonia . Pt reports she was running fevers at home. Pt reports she didn't take her temp but was taking tylenol . Last dose was 2 hrs PTA.Pt denies any chest pain/ or any other s/s.

## 2020-06-16 NOTE — ED Provider Notes (Signed)
[facility] EMERGENCY DEPARTMENT Provider Note   CSN: [id number] Arrival date & time: 06/16/20  5102     History Chief Complaint  Patient presents with  . Flank Pain    Sherri Gilbert is a 50 y.o. female.  HPI    50 year old female comes in a chief complaint of flank pain.  Patient reports that she has history of asthma, PUD that required surgical repair.  She is having back pain over the last 2 days.  The pain is intermittent.  There is no specific evoking, aggravating or relieving factors.  With that she does not have any burning with urination, blood in the urine, urinary frequency.  She denies any history of UTIs, pelvic infections and there is no vaginal discharge.  Patient denies any specific aggravating event such as heavy lifting, but she does lift heavy at work.   Review of system is negative for any cough, difficulty swallowing, chest pain, body aches, nausea, vomiting, fevers, chills.  Past Medical History:  Diagnosis Date  . Asthma   . Hypertension   . Peptic ulcer     Patient Active Problem List   Diagnosis Date Noted  . Perforated gastric ulcer s/p omental Cheree Ditto patch 07/10/2017 07/10/2017    Past Surgical History:  Procedure Laterality Date  . LAPAROTOMY N/A 07/10/2017   Procedure: EXPLORATORY LAPAROTOMY REPAIR OF PERFORATED ULCER AT PYLORUS;  Surgeon: Manus Rudd, MD;  Location: [facility];  Service: General;  Laterality: N/A;     OB History   No obstetric history on file.     Family History  Problem Relation Age of Onset  . Ulcers Father   . Colon cancer Neg Hx   . Esophageal cancer Neg Hx   . Rectal cancer Neg Hx     Social History   Tobacco Use  . Smoking status: Former Smoker    Packs/day: 0.50    Types: Cigarettes    Quit date: 06/08/2017    Years since quitting: 3.0  . Smokeless tobacco: Never Used  Vaping Use  . Vaping Use: Never used  Substance Use Topics  . Alcohol use: No  . Drug use: No    Home  Medications Prior to Admission medications   Medication Sig Start Date End Date Taking? Authorizing Provider  acetaminophen (TYLENOL) 325 MG tablet Take 650 mg by mouth every 6 (six) hours as needed for mild pain, fever or headache.   Yes [provider]    Allergies    Nsaids  Review of Systems   Review of Systems  Constitutional: Positive for activity change.  Respiratory: Negative for shortness of breath.   Cardiovascular: Negative for chest pain.  Musculoskeletal: Positive for myalgias.  Hematological: Does not bruise/bleed easily.  All other systems reviewed and are negative.   Physical Exam Updated Vital Signs BP (!) 147/85   Pulse 84   Temp 98.5 F (36.9 C) (Oral)   Resp 16   LMP 06/06/2020   SpO2 99%   Physical Exam Vitals and nursing note reviewed.  Constitutional:      Appearance: She is well-developed.  HENT:     Head: Normocephalic and atraumatic.  Eyes:     Extraocular Movements: EOM normal.  Cardiovascular:     Rate and Rhythm: Normal rate.  Pulmonary:     Effort: Pulmonary effort is normal.  Abdominal:     General: Bowel sounds are normal.  Musculoskeletal:     Cervical back: Normal range of motion and neck supple.  Comments: Patient has reproducible tenderness over the left posterior hip superiorly  No flank tenderness, no abdominal tenderness  Skin:    General: Skin is warm and dry.  Neurological:     Mental Status: She is alert and oriented to person, place, and time.     ED Results / Procedures / Treatments   Labs (all labs ordered are listed, but only abnormal results are displayed) Labs Reviewed  URINALYSIS, ROUTINE W REFLEX MICROSCOPIC - Abnormal; Notable for the following components:      Result Value   APPearance HAZY (*)    Hgb urine dipstick LARGE (*)    Leukocytes,Ua SMALL (*)    Bacteria, UA RARE (*)    All other components within normal limits  CBC - Abnormal; Notable for the following components:   RBC 5.15  (*)    All other components within normal limits  COMPREHENSIVE METABOLIC PANEL - Abnormal; Notable for the following components:   Potassium 3.4 (*)    CO2 20 (*)    Glucose, Bld 107 (*)    All other components within normal limits    EKG None  Radiology DG Chest 1 View  Result Date: 06/16/2020 CLINICAL DATA:  Questioned mass within the subcarinal mediastinum on AP chest x-ray. EXAM: CHEST  1 VIEW COMPARISON:  AP chest x-ray from earlier same day. FINDINGS: Lateral projection shows no evidence of mediastinal mass. Furthermore, configuration of the mediastinum on the AP view is similar to its appearance on acute abdomen series dated 07/13/2017, without obvious splaying of the main bronchi, suggesting benignity. IMPRESSION: Normal lateral view of the chest. As such, rounded opacity seen on AP view is most likely benign, as detailed above. In the absence of chest pain, dysphagia or other midline symptoms, would merely recommend follow-up chest x-ray in 3-6 months to ensure stability. If symptoms develop in the interval, would then recommend chest CT for definitive characterization. Electronically Signed   By: Bary Richard M.D.   On: 06/16/2020 13:09   DG Chest Port 1 View  Result Date: 06/16/2020 CLINICAL DATA:  Chest tightness, shortness of breath and slight fever for 3-4 days. EXAM: PORTABLE CHEST 1 VIEW COMPARISON:  None. FINDINGS: Heart size and mediastinal contours are within normal limits. Rounded opacity is present within the subcarinal region of the mediastinum, of uncertain etiology, possibly artifactual. Lungs are clear. No pleural effusion or pneumothorax is seen. Osseous structures about the chest are unremarkable. IMPRESSION: 1. Rounded mass-like opacity within the subcarinal region of the mediastinum, of uncertain etiology and possibly artifactual, but mediastinal mass or lymphadenopathy cannot be excluded. Recommend chest CT for further characterization. 2. Lungs are clear.  No  evidence of pneumonia. Electronically Signed   By: Bary Richard M.D.   On: 06/16/2020 07:54    Procedures Procedures (including critical care time)  Medications Ordered in ED Medications - No data to display  ED Course  I have reviewed the triage vital signs and the nursing notes.  Pertinent labs & imaging results that were available during my care of the patient were reviewed by me and considered in my medical decision making (see chart for details).    MDM Rules/Calculators/A&P                          50 year old comes in a chief complaint of flank pain.  No UTI-like symptoms, vaginal discharge.  No abdominal tenderness and frankly no flank tenderness.  There is no chest pain,  pleurisy with the abnormal looking x-ray.  I ordered a lateral x-ray after talk to the radiologist and they do not see any mediastinal mass.  Patient advised to get a repeat x-ray and 3 months.  For the flank pain, there is reproducible tenderness over the posterior superior hip.  I suspect that she is having spasms.  We will give her muscle relaxants.  Advised stretching exercises and warm compresses and multiple short burst of massage to the area.  UA did show hematuria but patient denied UTI-like symptoms on repeat questioning.  Patient has no nausea, vomiting and no other systemic symptoms concerning for pyelonephritis.  Will advise repeat UTI with PCP or insurance.  Unfortunately patient does not have a PCP therefore I have sent a message to our social work team to see if they can get her in with community wellness for repeat UA and chest x-ray.  Final Clinical Impression(s) / ED Diagnoses Final diagnoses:  Asymptomatic microscopic hematuria  Muscle spasm    Rx / DC Orders ED Discharge Orders    None       Derwood Kaplan, MD 06/16/20 1540

## 2020-06-18 ENCOUNTER — Telehealth: Payer: Self-pay

## 2020-06-18 NOTE — Telephone Encounter (Signed)
Message received from Michel Bickers, RN CM requesting a hospital follow up appointment for the patient.  Call placed to patient and scheduled her for appointment at [facility] 06/27/2020.  Provided her with the address and phone number for the clinic and encouraged her to call with any questions.  Also explained to her that she may qualify for [facility] Financial Assistance to assist with some of her medical costs.  She was very appreciative of the call.

## 2020-06-27 ENCOUNTER — Ambulatory Visit (INDEPENDENT_AMBULATORY_CARE_PROVIDER_SITE_OTHER): Payer: Medicaid Other

## 2021-01-08 ENCOUNTER — Emergency Department (HOSPITAL_COMMUNITY)
Admission: EM | Admit: 2021-01-08 | Discharge: 2021-01-08 | Disposition: A | Discharge status: Home / Self Care | Payer: Medicaid Other | Attending: Emergency Medicine | Admitting: Emergency Medicine

## 2021-01-08 ENCOUNTER — Other Ambulatory Visit: Payer: Self-pay

## 2021-01-08 DIAGNOSIS — I10 Essential (primary) hypertension: Secondary | ICD-10-CM | POA: Insufficient documentation

## 2021-01-08 DIAGNOSIS — J45909 Unspecified asthma, uncomplicated: Secondary | ICD-10-CM | POA: Insufficient documentation

## 2021-01-08 DIAGNOSIS — K047 Periapical abscess without sinus: Secondary | ICD-10-CM

## 2021-01-08 DIAGNOSIS — Z87891 Personal history of nicotine dependence: Secondary | ICD-10-CM | POA: Insufficient documentation

## 2021-01-08 MED ORDER — BUPIVACAINE-EPINEPHRINE (PF) 0.5% -1:200000 IJ SOLN
1.8000 mL | Freq: Once | INTRAMUSCULAR | Status: AC
  Administered 2021-01-08: 1.8 mL
  Filled 2021-01-08: qty 1.8

## 2021-01-08 MED ORDER — OXYCODONE-ACETAMINOPHEN 5-325 MG PO TABS
2.0000 | ORAL_TABLET | Freq: Once | ORAL | Status: AC
  Administered 2021-01-08: 2 via ORAL
  Filled 2021-01-08: qty 2

## 2021-01-08 MED ORDER — CLINDAMYCIN HCL 300 MG PO CAPS: 300.0000 mg | ORAL_CAPSULE | Freq: Three times a day (TID) | ORAL | 0 refills | Status: AC

## 2021-01-08 NOTE — ED Provider Notes (Signed)
[facility] EMERGENCY DEPARTMENT Provider Note   CSN: [id number] Arrival date & time: 01/08/21  0315     History Chief Complaint  Patient presents with   Dental Pain    Sherri Gilbert is a 50 y.o. female patient presents with 2 days of facial swelling and dental pain.  She has numerous broken teeth in the left upper jaw.  Was seen by a dentist yesterday and referred to the emergency department for antibiotics.  Patient reports pain is severe and is giving her an associated headache.  Denies fevers or chills at home.  Is some anxious and is frustrated about the incomplete treatment received at the dentist.  Eating and drinking make her symptoms worse.  Nothing seems to make them better despite over-the-counter medications.  The history is provided by the patient and medical records. No language interpreter was used.      Past Medical History:  Diagnosis Date   Asthma    Hypertension    Peptic ulcer     Patient Active Problem List   Diagnosis Date Noted   Perforated gastric ulcer s/p omental Graham patch 07/10/2017 07/10/2017    Past Surgical History:  Procedure Laterality Date   LAPAROTOMY N/A 07/10/2017   Procedure: EXPLORATORY LAPAROTOMY REPAIR OF PERFORATED ULCER AT PYLORUS;  Surgeon: Manus Rudd, MD;  Location: [facility];  Service: General;  Laterality: N/A;     OB History   No obstetric history on file.     Family History  Problem Relation Age of Onset   Ulcers Father    Colon cancer Neg Hx    Esophageal cancer Neg Hx    Rectal cancer Neg Hx     Social History   Tobacco Use   Smoking status: Former    Packs/day: 0.50    Types: Cigarettes    Quit date: 06/08/2017    Years since quitting: 3.5   Smokeless tobacco: Never  Vaping Use   Vaping Use: Never used  Substance Use Topics   Alcohol use: No   Drug use: No    Home Medications Prior to Admission medications   Medication Sig Start Date End Date Taking? Authorizing Provider   clindamycin (CLEOCIN) 300 MG capsule Take 1 capsule (300 mg total) by mouth 3 (three) times daily for 10 days. 01/08/21 01/18/21 Yes Vernisha Bacote, Dahlia Client, PA-C  acetaminophen (TYLENOL 8 HOUR) 650 MG CR tablet Take 1 tablet (650 mg total) by mouth every 8 (eight) hours as needed for pain or fever. 06/16/20   Derwood Kaplan, MD  cyclobenzaprine (FLEXERIL) 10 MG tablet Take 1 tablet (10 mg total) by mouth 2 (two) times daily as needed for muscle spasms. 06/16/20   Derwood Kaplan, MD    Allergies    Nsaids  Review of Systems   Review of Systems  Constitutional:  Negative for chills and fever.  HENT:  Positive for dental problem and facial swelling.   Respiratory:  Negative for chest tightness and shortness of breath.   Cardiovascular:  Negative for chest pain.  Gastrointestinal:  Negative for abdominal pain, nausea and vomiting.  Musculoskeletal:  Negative for neck pain and neck stiffness.  Skin:  Negative for rash.  Allergic/Immunologic: Negative for immunocompromised state.  Neurological:  Positive for headaches.  Hematological:  Does not bruise/bleed easily.  Psychiatric/Behavioral:  The patient is nervous/anxious.    Physical Exam Updated Vital Signs BP (!) 152/94 (BP Location: Right Arm)   Pulse (!) 107   Temp 98.6 F (37 C) (Oral)  Resp 20   SpO2 100%   Physical Exam Vitals and nursing note reviewed.  Constitutional:      General: She is not in acute distress.    Appearance: She is well-developed. She is not ill-appearing.  HENT:     Head: Normocephalic. No right periorbital erythema or left periorbital erythema.     Jaw: No trismus.      Mouth/Throat:     Mouth: Mucous membranes are moist.     Dentition: Abnormal dentition. Dental tenderness, dental caries and dental abscesses present.     Tongue: No lesions.     Palate: No mass.     Pharynx: Oropharynx is clear.     Tonsils: No tonsillar exudate.   Eyes:     General: No scleral icterus.    Conjunctiva/sclera:  Conjunctivae normal.  Cardiovascular:     Rate and Rhythm: Normal rate.  Pulmonary:     Effort: Pulmonary effort is normal.  Abdominal:     General: There is no distension.  Musculoskeletal:        General: Normal range of motion.     Cervical back: Normal range of motion.  Skin:    General: Skin is warm and dry.  Neurological:     Mental Status: She is alert.  Psychiatric:        Mood and Affect: Mood normal.    ED Results / Procedures / Treatments     Procedures .Marland KitchenIncision and Drainage  Date/Time: 01/08/2021 6:05 AM Performed by: Dierdre Forth, PA-C Authorized by: Dierdre Forth, PA-C   Consent:    Consent obtained:  Verbal   Consent given by:  Patient   Risks discussed:  Bleeding, incomplete drainage, pain and damage to other organs   Alternatives discussed:  No treatment Universal protocol:    Procedure explained and questions answered to patient or proxy's satisfaction: yes     Relevant documents present and verified: yes     Test results available : yes     Imaging studies available: yes     Required blood products, implants, devices, and special equipment available: yes     Site/side marked: yes     Immediately prior to procedure, a time out was called: yes     Patient identity confirmed:  Verbally with patient Location:    Type:  Abscess   Location:  Mouth   Mouth location:  Alveolar process Sedation:    Sedation type:  None Anesthesia:    Anesthesia method:  Local infiltration   Local anesthetic:  Bupivacaine 0.5% WITH epi Procedure type:    Complexity:  Complex Procedure details:    Needle aspiration: yes     Needle size:  18 G   Incision types:  Single straight   Incision depth:  Subcutaneous   Wound management:  Probed and deloculated, irrigated with saline and extensive cleaning   Drainage:  Purulent   Drainage amount:  Moderate   Wound treatment:  Wound left open Post-procedure details:    Procedure completion:  Tolerated well,  no immediate complications   Medications Ordered in ED Medications  oxyCODONE-acetaminophen (PERCOCET/ROXICET) 5-325 MG per tablet 2 tablet (2 tablets Oral Given 01/08/21 0612)  bupivacaine-epinephrine (MARCAINE W/ EPI) 0.5% -1:200000 injection 1.8 mL (1.8 mLs Infiltration Given 01/08/21 3825)    ED Course  I have reviewed the triage vital signs and the nursing notes.  Pertinent labs & imaging results that were available during my care of the patient were reviewed by me and considered in  my medical decision making (see chart for details).    MDM Rules/Calculators/A&P                           Patient presents with dental caries, dental infection and dental abscess.  I&D of dental abscess without complication.  Copious amounts of purulent drainage.  Patient started on clindamycin.  Given resources for outpatient dentist.  Discussed reasons to return immediately to the emergency department.  Patient states understanding and is in agreement with the plan.   Final Clinical Impression(s) / ED Diagnoses Final diagnoses:  Dental abscess    Rx / DC Orders ED Discharge Orders          Ordered    clindamycin (CLEOCIN) 300 MG capsule  3 times daily        01/08/21 0626             Ladarryl Wrage, Dahlia Client, PA-C 01/08/21 0177    Dione Booze, MD 01/08/21 [phone]

## 2021-01-08 NOTE — Discharge Instructions (Addendum)
1. Medications: Clindamycin, usual home medications °2. Treatment: rest, drink plenty of fluids, take medications as prescribed °3. Follow Up: Please followup with dentistry within 1 week for discussion of your diagnoses and further evaluation after today's visit; if you do not have a primary care doctor use the resource guide provided to find one; Return to the ER for high fevers, difficulty breathing, difficulty swallowing or other concerning symptoms ° °

## 2021-01-08 NOTE — ED Triage Notes (Signed)
Patient here with dental abscess on upper left jaw.  Patient was sent by dental office to ED due to not having insurance.  Patient with pain and swelling getting worse.

## 2021-02-17 ENCOUNTER — Emergency Department (HOSPITAL_COMMUNITY)
Admission: EM | Admit: 2021-02-17 | Discharge: 2021-02-18 | Disposition: A | Discharge status: Home / Self Care | Payer: Medicaid Other | Attending: Emergency Medicine | Admitting: Emergency Medicine

## 2021-02-17 ENCOUNTER — Other Ambulatory Visit: Payer: Self-pay

## 2021-02-17 DIAGNOSIS — Z87891 Personal history of nicotine dependence: Secondary | ICD-10-CM | POA: Insufficient documentation

## 2021-02-17 DIAGNOSIS — K0889 Other specified disorders of teeth and supporting structures: Secondary | ICD-10-CM

## 2021-02-17 DIAGNOSIS — I10 Essential (primary) hypertension: Secondary | ICD-10-CM | POA: Insufficient documentation

## 2021-02-17 DIAGNOSIS — K029 Dental caries, unspecified: Secondary | ICD-10-CM | POA: Insufficient documentation

## 2021-02-17 DIAGNOSIS — J45909 Unspecified asthma, uncomplicated: Secondary | ICD-10-CM | POA: Insufficient documentation

## 2021-02-17 NOTE — ED Provider Notes (Signed)
Emergency Medicine Provider Triage Evaluation Note  Sherri Gilbert , a 50 y.o. female  was evaluated in triage.  Pt complains of dental pain.  Patient has been having problems with her right furthest back molar for about a month now.  She is following with a dentist and is planning to have surgery at the end of the month.  She says the pain is worsened over the past few days.  She called her dentist and he had told her to come here to get antibiotics.  She is denies any fever or chills, chest pain, shortness of breath, abdominal pain.  Review of Systems  Positive: Dental pain Negative: Chest pain, fever, chills, shortness of breath  Physical Exam  BP (!) 152/91 (BP Location: Right Arm)   Pulse 95   Temp 98.5 F (36.9 C) (Oral)   Resp 17   Ht 5\' 6"  (1.676 m)   Wt 55 kg   SpO2 100%   BMI 19.57 kg/m  Gen:   Awake, no distress  Resp:  Normal effort  MSK:   Moves extremities without difficulty  Other:  Dental pain to right upper molar farthest back.  No drainable abscess noted on exam.   Medical Decision Making  Medically screening exam initiated at 2:04 PM.  Appropriate orders placed.  Sherri Gilbert was informed that the remainder of the evaluation will be completed by another provider, this initial triage assessment does not replace that evaluation, and the importance of remaining in the ED until their evaluation is complete.   Sherri Knife, PA-C 02/17/21 1407    04/19/21, DO 02/17/21 1743

## 2021-02-17 NOTE — ED Triage Notes (Signed)
Pt arrived to triage c/o lower right sided dental pain.  Not able to see dentist till end of month  Deneis fever/chills  Pain is 10/10 has been taking tylenol for pain

## 2021-02-18 MED ORDER — AMOXICILLIN 500 MG PO CAPS: 500.0000 mg | ORAL_CAPSULE | Freq: Three times a day (TID) | ORAL | 0 refills | Status: DC

## 2021-02-18 MED ORDER — HYDROCODONE-ACETAMINOPHEN 5-325 MG PO TABS
1.0000 | ORAL_TABLET | Freq: Once | ORAL | Status: AC
  Administered 2021-02-18: 1 via ORAL
  Filled 2021-02-18: qty 1

## 2021-02-18 MED ORDER — AMOXICILLIN 500 MG PO CAPS
500.0000 mg | ORAL_CAPSULE | Freq: Once | ORAL | Status: AC
  Administered 2021-02-18: 500 mg via ORAL
  Filled 2021-02-18: qty 1

## 2021-02-18 NOTE — ED Provider Notes (Signed)
[facility] EMERGENCY DEPARTMENT Provider Note   CSN: [id number] Arrival date & time: 02/17/21  1254     History Chief Complaint  Patient presents with   Dental Pain    Sherri Gilbert is a 50 y.o. female.  The history is provided by the patient. No language interpreter was used.  Dental Pain Location:  Lower Lower teeth location: R lower molar. Quality:  Aching Onset quality:  Gradual Duration:  1 month Timing:  Constant Progression:  Worsening (over the past few days) Chronicity:  Recurrent Context: poor dentition   Context: not trauma   Relieved by:  Nothing Ineffective treatments:  Acetaminophen Associated symptoms: facial pain   Associated symptoms: no difficulty swallowing, no fever and no trismus       Past Medical History:  Diagnosis Date   Asthma    Hypertension    Peptic ulcer     Patient Active Problem List   Diagnosis Date Noted   Perforated gastric ulcer s/p omental Graham patch 07/10/2017 07/10/2017    Past Surgical History:  Procedure Laterality Date   LAPAROTOMY N/A 07/10/2017   Procedure: EXPLORATORY LAPAROTOMY REPAIR OF PERFORATED ULCER AT PYLORUS;  Surgeon: Manus Rudd, MD;  Location: [facility];  Service: General;  Laterality: N/A;     OB History   No obstetric history on file.     Family History  Problem Relation Age of Onset   Ulcers Father    Colon cancer Neg Hx    Esophageal cancer Neg Hx    Rectal cancer Neg Hx     Social History   Tobacco Use   Smoking status: Former    Packs/day: 0.50    Types: Cigarettes    Quit date: 06/08/2017    Years since quitting: 3.7   Smokeless tobacco: Never  Vaping Use   Vaping Use: Never used  Substance Use Topics   Alcohol use: No   Drug use: No    Home Medications Prior to Admission medications   Medication Sig Start Date End Date Taking? Authorizing Provider  amoxicillin (AMOXIL) 500 MG capsule Take 1 capsule (500 mg total) by mouth 3 (three) times daily. 02/18/21   Yes Antony Madura, PA-C  acetaminophen (TYLENOL 8 HOUR) 650 MG CR tablet Take 1 tablet (650 mg total) by mouth every 8 (eight) hours as needed for pain or fever. 06/16/20   Derwood Kaplan, MD  cyclobenzaprine (FLEXERIL) 10 MG tablet Take 1 tablet (10 mg total) by mouth 2 (two) times daily as needed for muscle spasms. 06/16/20   Derwood Kaplan, MD    Allergies    Nsaids  Review of Systems   Review of Systems  Constitutional:  Negative for fever.  HENT:  Positive for dental problem.   Ten systems reviewed and are negative for acute change, except as noted in the HPI.    Physical Exam Updated Vital Signs BP (!) 153/98 (BP Location: Right Arm)   Pulse 83   Temp 98.2 F (36.8 C) (Oral)   Resp 18   Ht 5\' 6"  (1.676 m)   Wt 55 kg   SpO2 100%   BMI 19.57 kg/m   Physical Exam Vitals and nursing note reviewed.  Constitutional:      General: She is not in acute distress.    Appearance: She is well-developed. She is not diaphoretic.     Comments: Nontoxic-appearing and in no distress  HENT:     Head: Normocephalic and atraumatic.     Mouth/Throat:  Mouth: Mucous membranes are moist.     Dentition: Abnormal dentition. Dental tenderness and dental caries present. No dental abscesses.      Comments: No trismus or malocclusion.  Tolerating secretions without difficulty. Eyes:     General: No scleral icterus.    Conjunctiva/sclera: Conjunctivae normal.  Neck:     Comments: No nuchal rigidity or meningismus Pulmonary:     Effort: Pulmonary effort is normal. No respiratory distress.  Musculoskeletal:        General: Normal range of motion.     Cervical back: Normal range of motion.  Skin:    General: Skin is warm and dry.     Coloration: Skin is not pale.     Findings: No erythema or rash.  Neurological:     Mental Status: She is alert and oriented to person, place, and time.  Psychiatric:        Behavior: Behavior normal.    ED Results / Procedures / Treatments   Labs (all  labs ordered are listed, but only abnormal results are displayed) Labs Reviewed - No data to display  EKG None  Radiology No results found.  Procedures Procedures   Medications Ordered in ED Medications  HYDROcodone-acetaminophen (NORCO/VICODIN) 5-325 MG per tablet 1 tablet (1 tablet Oral Given 02/18/21 0018)  amoxicillin (AMOXIL) capsule 500 mg (500 mg Oral Given 02/18/21 0019)    ED Course  I have reviewed the triage vital signs and the nursing notes.  Pertinent labs & imaging results that were available during my care of the patient were reviewed by me and considered in my medical decision making (see chart for details).    MDM Rules/Calculators/A&P                           Patient with toothache.  No gross abscess.  Exam unconcerning for Ludwig's angina or spread of infection.  Will treat with Amoxicillin.  Urged patient to follow-up with dentist.  Return precautions discussed and provided. Patient discharged in stable condition with no unaddressed concerns.   Final Clinical Impression(s) / ED Diagnoses Final diagnoses:  Dentalgia    Rx / DC Orders ED Discharge Orders          Ordered    amoxicillin (AMOXIL) 500 MG capsule  3 times daily        02/18/21 0013             Antony Madura, PA-C 02/18/21 0259    Zadie Rhine, MD 02/19/21 [phone]

## 2021-02-18 NOTE — Discharge Instructions (Addendum)
Follow-up with a dentist.  Only a dentist can fix your problem.  We recommend that you take amoxicillin as prescribed to treat likely underlying infection.  Take OTC medications as needed for pain control.  Return to the ED for any new or concerning symptoms.

## 2021-07-08 ENCOUNTER — Encounter (HOSPITAL_COMMUNITY): Payer: Self-pay

## 2021-07-08 ENCOUNTER — Ambulatory Visit (HOSPITAL_COMMUNITY)
Admission: EM | Admit: 2021-07-08 | Discharge: 2021-07-08 | Disposition: A | Discharge status: Home / Self Care | Payer: Medicaid Other | Attending: Internal Medicine | Admitting: Internal Medicine

## 2021-07-08 ENCOUNTER — Other Ambulatory Visit: Payer: Self-pay

## 2021-07-08 DIAGNOSIS — K047 Periapical abscess without sinus: Secondary | ICD-10-CM

## 2021-07-08 MED ORDER — HYDROCODONE-ACETAMINOPHEN 5-325 MG PO TABS
ORAL_TABLET | ORAL | Status: AC
  Filled 2021-07-08: qty 1

## 2021-07-08 MED ORDER — TRAMADOL HCL 50 MG PO TABS: 50.0000 mg | ORAL_TABLET | Freq: Two times a day (BID) | ORAL | 0 refills | Status: DC | PRN

## 2021-07-08 MED ORDER — AMOXICILLIN-POT CLAVULANATE 875-125 MG PO TABS: 1.0000 | ORAL_TABLET | Freq: Two times a day (BID) | ORAL | 0 refills | Status: DC

## 2021-07-08 MED ORDER — HYDROCODONE-ACETAMINOPHEN 5-325 MG PO TABS
1.0000 | ORAL_TABLET | Freq: Once | ORAL | Status: AC
  Administered 2021-07-08: 1 via ORAL

## 2021-07-08 NOTE — Discharge Instructions (Addendum)
Please take medications as prescribed Follow-up with your oral surgeon to have dental work done If you have any further concerns please reach out to the urgent care

## 2021-07-08 NOTE — ED Provider Notes (Signed)
[facility]    CSN: [id number] Arrival date & time: 07/08/21  0844      History   Chief Complaint Chief Complaint  Patient presents with   Dental Pain    HPI Sherri Gilbert is a 51 y.o. female comes to the urgent care with several days history of dental pain.  Patient has dental cavity in the second right maxillary tooth.  Patient describes the pain as severe, throbbing, constant, not relieved with Tylenol.  No gum discharge.  No fever or chills.  Is associated with swelling of the right jaw.  Patient is waiting for Medicaid to be able to go for dental care.Marland Kitchen   HPI  Past Medical History:  Diagnosis Date   Asthma    Hypertension    Peptic ulcer     Patient Active Problem List   Diagnosis Date Noted   Perforated gastric ulcer s/p omental Graham patch 07/10/2017 07/10/2017    Past Surgical History:  Procedure Laterality Date   LAPAROTOMY N/A 07/10/2017   Procedure: EXPLORATORY LAPAROTOMY REPAIR OF PERFORATED ULCER AT PYLORUS;  Surgeon: Manus Rudd, MD;  Location: [facility];  Service: General;  Laterality: N/A;    OB History   No obstetric history on file.      Home Medications    Prior to Admission medications   Medication Sig Start Date End Date Taking? Authorizing Provider  amoxicillin-clavulanate (AUGMENTIN) 875-125 MG tablet Take 1 tablet by mouth every 12 (twelve) hours. 07/08/21  Yes Casey Maxfield, Britta Mccreedy, MD  traMADol (ULTRAM) 50 MG tablet Take 1 tablet (50 mg total) by mouth every 12 (twelve) hours as needed. 07/08/21  Yes Alexiz Cothran, Britta Mccreedy, MD  acetaminophen (TYLENOL 8 HOUR) 650 MG CR tablet Take 1 tablet (650 mg total) by mouth every 8 (eight) hours as needed for pain or fever. 06/16/20   Derwood Kaplan, MD    Family History Family History  Problem Relation Age of Onset   Ulcers Father    Colon cancer Neg Hx    Esophageal cancer Neg Hx    Rectal cancer Neg Hx     Social History Social History   Tobacco Use   Smoking status: Former     Packs/day: 0.50    Types: Cigarettes    Quit date: 06/08/2017    Years since quitting: 4.0   Smokeless tobacco: Never  Vaping Use   Vaping Use: Never used  Substance Use Topics   Alcohol use: No   Drug use: No     Allergies   Nsaids   Review of Systems Review of Systems  HENT:  Positive for dental problem. Negative for sinus pressure, sinus pain and sore throat.   Neurological: Negative.     Physical Exam Triage Vital Signs ED Triage Vitals  Enc Vitals Group     BP 07/08/21 1007 (!) 142/75     Pulse Rate 07/08/21 1007 81     Resp 07/08/21 1007 14     Temp 07/08/21 1007 98.8 F (37.1 C)     Temp Source 07/08/21 1007 Oral     SpO2 07/08/21 1007 98 %     Weight 07/08/21 1009 160 lb (72.6 kg)     Height --      Head Circumference --      Peak Flow --      Pain Score 07/08/21 1009 10     Pain Loc --      Pain Edu? --      Excl. in GC? --  No data found.  Updated Vital Signs BP (!) 142/75 (BP Location: Right Arm)    Pulse 81    Temp 98.8 F (37.1 C) (Oral)    Resp 14    Wt 72.6 kg    SpO2 98%    BMI 25.82 kg/m   Visual Acuity Right Eye Distance:   Left Eye Distance:   Bilateral Distance:    Right Eye Near:   Left Eye Near:    Bilateral Near:     Physical Exam Vitals and nursing note reviewed.  Constitutional:      General: She is in acute distress.     Appearance: She is not ill-appearing.  HENT:     Right Ear: Tympanic membrane normal.     Left Ear: Tympanic membrane normal.     Nose:     Comments: Dental cavity, multiple.  Involves the second right maxillary molar. Cardiovascular:     Rate and Rhythm: Normal rate and regular rhythm.  Musculoskeletal:        General: Normal range of motion.  Neurological:     Mental Status: She is alert.     UC Treatments / Results  Labs (all labs ordered are listed, but only abnormal results are displayed) Labs Reviewed - No data to display  EKG   Radiology No results found.  Procedures Procedures  (including critical care time)  Medications Ordered in UC Medications  HYDROcodone-acetaminophen (NORCO/VICODIN) 5-325 MG per tablet 1 tablet (has no administration in time range)    Initial Impression / Assessment and Plan / UC Course  I have reviewed the triage vital signs and the nursing notes.  Pertinent labs & imaging results that were available during my care of the patient were reviewed by me and considered in my medical decision making (see chart for details).     1.  Dental infection: Hydrocodone-acetaminophen x1 dose Augmentin 875-125 mg twice daily for 7 days Tramadol as needed for pain Patient encouraged to follow-up with the dental surgeon Return precautions given. Final Clinical Impressions(s) / UC Diagnoses   Final diagnoses:  Dental infection     Discharge Instructions      Please take medications as prescribed Follow-up with your oral surgeon to have dental work done If you have any further concerns please reach out to the urgent care   ED Prescriptions     Medication Sig Dispense Auth. Provider   amoxicillin-clavulanate (AUGMENTIN) 875-125 MG tablet Take 1 tablet by mouth every 12 (twelve) hours. 14 tablet Vanya Carberry, Britta Mccreedy, MD   traMADol (ULTRAM) 50 MG tablet Take 1 tablet (50 mg total) by mouth every 12 (twelve) hours as needed. 12 tablet Nikka Hakimian, Britta Mccreedy, MD      I have reviewed the PDMP during this encounter.   Merrilee Jansky, MD 07/08/21 1022

## 2021-07-08 NOTE — ED Triage Notes (Signed)
Pt presents with rt side dental pain and swelling

## 2022-03-30 ENCOUNTER — Encounter (HOSPITAL_COMMUNITY): Payer: Self-pay

## 2022-03-30 ENCOUNTER — Ambulatory Visit (HOSPITAL_COMMUNITY)
Admission: EM | Admit: 2022-03-30 | Discharge: 2022-03-30 | Disposition: A | Discharge status: Home / Self Care | Payer: Self-pay | Attending: Internal Medicine | Admitting: Internal Medicine

## 2022-03-30 DIAGNOSIS — K0889 Other specified disorders of teeth and supporting structures: Secondary | ICD-10-CM

## 2022-03-30 DIAGNOSIS — K047 Periapical abscess without sinus: Secondary | ICD-10-CM

## 2022-03-30 MED ORDER — AMOXICILLIN-POT CLAVULANATE 875-125 MG PO TABS: 1.0000 | ORAL_TABLET | Freq: Two times a day (BID) | ORAL | 0 refills | Status: DC

## 2022-03-30 MED ORDER — TRAMADOL HCL 50 MG PO TABS: 50.0000 mg | ORAL_TABLET | Freq: Four times a day (QID) | ORAL | 0 refills | Status: AC | PRN

## 2022-03-30 NOTE — Discharge Instructions (Signed)
Take Augmentin twice daily for the next 7 days to treat your dental infection. Take this medicine with food to avoid stomach upset.  You may take Tylenol every 6 hours as needed for dental pain, or you may take tramadol every 6 hours as needed for severe dental pain. If you do need to take tramadol for severe pain, switch back to taking Tylenol once the pain is well controlled with tramadol and use the severe pain medicine sparingly.  Call your dental surgeon to update him and let him know that your tooth is infected and that you are taking antibiotics to reduce the infection prior to the surgery on November 3.  If you develop any new or worsening symptoms or do not improve in the next 2 to 3 days, please return.  If your symptoms are severe, please go to the emergency room.  Follow-up with your primary care provider for further evaluation and management of your symptoms as well as ongoing wellness visits.  I hope you feel better!

## 2022-03-30 NOTE — ED Provider Notes (Signed)
[facility]    CSN: [id number] Arrival date & time: 03/30/22  0801      History   Chief Complaint Chief Complaint  Patient presents with   Dental Pain    HPI Sherri Gilbert is a 51 y.o. female.   Patient presents urgent care for evaluation of left upper mouth dental pain that started couple of days ago.  She is scheduled to have a broken tooth removed from the left upper aspect of her mouth on November 3 (approximately 10 days from now) and has been advised by her surgeon to come to urgent care if she feels as though the tooth is infected.  Pain worsened a couple of days ago and patient is only able to take Tylenol for the pain as she is allergic to NSAIDs.  Tylenol has not been helping with the discomfort and the pain level is currently a 10 on a scale of 0-10.  She is noticing drainage from the tooth as well.  Denies fever or chills, nausea, vomiting, sore throat, ear pain, neck pain, shortness of breath, chest pain, or other URI symptoms.  She is not allergic to any antibiotics and has not had any antibiotic therapy in the last 1 month.   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      Home Medications    Prior to Admission medications   Medication Sig Start Date End Date Taking? Authorizing Provider  acetaminophen (TYLENOL 8 HOUR) 650 MG CR tablet Take 1 tablet (650 mg total) by mouth every 8 (eight) hours as needed for pain or fever. 06/16/20  Yes Derwood Kaplan, MD  amoxicillin-clavulanate  (AUGMENTIN) 875-125 MG tablet Take 1 tablet by mouth every 12 (twelve) hours. 03/30/22   Carlisle Beers, FNP  traMADol (ULTRAM) 50 MG tablet Take 1 tablet (50 mg total) by mouth every 6 (six) hours as needed for up to 3 days. 03/30/22 04/02/22  Carlisle Beers, FNP    Family History Family History  Problem Relation Age of Onset   Ulcers Father    Colon cancer Neg Hx    Esophageal cancer Neg Hx    Rectal cancer Neg Hx     Social History Social History   Tobacco Use   Smoking status: Former    Packs/day: 0.50    Types: Cigarettes    Quit date: 06/08/2017    Years since quitting: 4.8   Smokeless tobacco: Never  Vaping Use   Vaping Use: Never used  Substance Use Topics   Alcohol use: No   Drug use: No     Allergies   Nsaids   Review of Systems Review of Systems Per HPI  Physical Exam Triage Vital Signs ED Triage Vitals [03/30/22 0814]  Enc Vitals Group     BP 139/84     Pulse Rate 88     Resp 16     Temp 98.4 F (36.9 C)     Temp Source Oral  SpO2 99 %     Weight      Height      Head Circumference      Peak Flow      Pain Score 10     Pain Loc      Pain Edu?      Excl. in [place]?    No data found.  Updated Vital Signs BP 139/84 (BP Location: Right Arm)   Pulse 88   Temp 98.4 F (36.9 C) (Oral)   Resp 16   LMP  (LMP Unknown)   SpO2 99%   Visual Acuity Right Eye Distance:   Left Eye Distance:   Bilateral Distance:    Right Eye Near:   Left Eye Near:    Bilateral Near:     Physical Exam Vitals and nursing note reviewed.  Constitutional:      Appearance: She is not ill-appearing or toxic-appearing.  HENT:     Head: Normocephalic and atraumatic.     Right Ear: Hearing and external ear normal.     Left Ear: Hearing and external ear normal.     Nose: Nose normal.     Mouth/Throat:     Lips: Pink.     Mouth: Mucous membranes are moist.     Dentition: Abnormal dentition. Dental tenderness and dental caries present.     Pharynx:  Oropharynx is clear. Uvula midline. No posterior oropharyngeal erythema or uvula swelling.     Tonsils: No tonsillar exudate or tonsillar abscesses.      Comments: Dental infection to teeth indicated above with visible decay and drainage.  No swelling to the oropharynx, gingiva, or mandible/submandibular area. Eyes:     General: Lids are normal. Vision grossly intact. Gaze aligned appropriately.     Extraocular Movements: Extraocular movements intact.     Conjunctiva/sclera: Conjunctivae normal.  Pulmonary:     Effort: Pulmonary effort is normal.  Musculoskeletal:     Cervical back: Neck supple.  Skin:    General: Skin is warm and dry.     Capillary Refill: Capillary refill takes less than 2 seconds.     Findings: No rash.  Neurological:     General: No focal deficit present.     Mental Status: She is alert and oriented to person, place, and time. Mental status is at baseline.     Cranial Nerves: No dysarthria or facial asymmetry.  Psychiatric:        Mood and Affect: Mood normal.        Speech: Speech normal.        Behavior: Behavior normal.        Thought Content: Thought content normal.        Judgment: Judgment normal.      UC Treatments / Results  Labs (all labs ordered are listed, but only abnormal results are displayed) Labs Reviewed - No data to display  EKG   Radiology No results found.  Procedures Procedures (including critical care time)  Medications Ordered in UC Medications - No data to display  Initial Impression / Assessment and Plan / UC Course  I have reviewed the triage vital signs and the nursing notes.  Pertinent labs & imaging results that were available during my care of the patient were reviewed by me and considered in my medical decision making (see chart for details).   1.  Dental infection and dental pain Patient has a scheduled procedure to have infected tooth removed in approximately 10 days and has been advised  by surgeon to come to  urgent care if she feels that the tooth is infected as they are unable to perform the surgery if the tooth is swollen and infected.  She is not allergic to any antibiotics.  Therefore, we will prescribe Augmentin twice daily for the next 7 days to trea dental infection.  She may continue to take Tylenol every 6 hours as needed for pain and may use tramadol every 6 hours for severe pain.  She has been advised to avoid driving/drinking alcohol while taking tramadol due to drowsiness side effects.  She is only to take tramadol for severe pain and switch back to using Tylenol once pain is well controlled with tramadol.  She is not a candidate for NSAID therapy as she is allergic.  She expresses agreement with this plan.  Work note given.  Advised to call dentist to make them aware of infection and follow-up with them for further plan of care regarding tooth removal procedure.   Discussed physical exam and available lab work findings in clinic with patient.  Counseled patient regarding appropriate use of medications and potential side effects for all medications recommended or prescribed today. Discussed red flag signs and symptoms of worsening condition,when to call the PCP office, return to urgent care, and when to seek higher level of care in the emergency department. Patient verbalizes understanding and agreement with plan. All questions answered. Patient discharged in stable condition.    Final Clinical Impressions(s) / UC Diagnoses   Final diagnoses:  Dental infection  Pain, dental     Discharge Instructions      Take Augmentin twice daily for the next 7 days to treat your dental infection. Take this medicine with food to avoid stomach upset.  You may take Tylenol every 6 hours as needed for dental pain, or you may take tramadol every 6 hours as needed for severe dental pain. If you do need to take tramadol for severe pain, switch back to taking Tylenol once the pain is well controlled with  tramadol and use the severe pain medicine sparingly.  Call your dental surgeon to update him and let him know that your tooth is infected and that you are taking antibiotics to reduce the infection prior to the surgery on November 3.  If you develop any new or worsening symptoms or do not improve in the next 2 to 3 days, please return.  If your symptoms are severe, please go to the emergency room.  Follow-up with your primary care provider for further evaluation and management of your symptoms as well as ongoing wellness visits.  I hope you feel better!   ED Prescriptions     Medication Sig Dispense Auth. Provider   amoxicillin-clavulanate (AUGMENTIN) 875-125 MG tablet Take 1 tablet by mouth every 12 (twelve) hours. 14 tablet Carlisle Beers, FNP   traMADol (ULTRAM) 50 MG tablet Take 1 tablet (50 mg total) by mouth every 6 (six) hours as needed for up to 3 days. 12 tablet Carlisle Beers, FNP      I have reviewed the PDMP during this encounter.   Carlisle Beers, [place] 03/30/22 [phone]

## 2022-03-30 NOTE — ED Triage Notes (Signed)
Patient having dental pain in the upper left side. States she has a bad tooth that is scheduled to be removed 04/10/22.  States she is now having pain and swelling, thinks it is infected. Onset 3 days ago.

## 2022-03-31 ENCOUNTER — Telehealth (HOSPITAL_COMMUNITY): Payer: Self-pay | Admitting: Emergency Medicine

## 2022-03-31 MED ORDER — ACETAMINOPHEN 500 MG PO TABS: 1000.0000 mg | ORAL_TABLET | Freq: Three times a day (TID) | ORAL | 0 refills | Status: DC | PRN

## 2022-04-26 ENCOUNTER — Ambulatory Visit (HOSPITAL_COMMUNITY)
Admission: EM | Admit: 2022-04-26 | Discharge: 2022-04-26 | Disposition: A | Discharge status: Home / Self Care | Payer: Medicaid Other | Attending: Family Medicine | Admitting: Family Medicine

## 2022-04-26 ENCOUNTER — Other Ambulatory Visit: Payer: Self-pay

## 2022-04-26 ENCOUNTER — Encounter (HOSPITAL_COMMUNITY): Payer: Self-pay | Admitting: *Deleted

## 2022-04-26 DIAGNOSIS — K0889 Other specified disorders of teeth and supporting structures: Secondary | ICD-10-CM

## 2022-04-26 MED ORDER — AMOXICILLIN-POT CLAVULANATE 875-125 MG PO TABS: 1.0000 | ORAL_TABLET | Freq: Two times a day (BID) | ORAL | 0 refills | Status: AC

## 2022-04-26 MED ORDER — ACETAMINOPHEN-CODEINE 300-30 MG PO TABS: 1.0000 | ORAL_TABLET | Freq: Four times a day (QID) | ORAL | 0 refills | Status: DC | PRN
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
## 2022-04-26 NOTE — Discharge Instructions (Signed)
Take amoxicillin-clavulanate 875 mg--1 tab twice daily with food for 7 days  Tylenol with codeine--1 every 6 hours as needed for pain.  Please try to have a little something on your stomach when you take this medication.  It can cause dizziness or sleepiness

## 2022-08-26 ENCOUNTER — Ambulatory Visit (HOSPITAL_COMMUNITY)
Admission: EM | Admit: 2022-08-26 | Discharge: 2022-08-26 | Disposition: A | Discharge status: Home / Self Care | Payer: Self-pay | Attending: Sports Medicine | Admitting: Sports Medicine

## 2022-08-26 ENCOUNTER — Encounter (HOSPITAL_COMMUNITY): Payer: Self-pay

## 2022-08-26 DIAGNOSIS — S7002XA Contusion of left hip, initial encounter: Secondary | ICD-10-CM

## 2022-08-26 MED ORDER — CYCLOBENZAPRINE HCL 10 MG PO TABS: 10.0000 mg | ORAL_TABLET | Freq: Two times a day (BID) | ORAL | 0 refills | Status: DC | PRN

## 2022-08-26 NOTE — Discharge Instructions (Signed)
Voltaren gel (available over the counter) might help with your muscle soreness Heat, ice, tylenol will help with the soreness Do NOT take ibuprofen  It was nice to meet you today!
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
## 2022-11-16 ENCOUNTER — Ambulatory Visit (HOSPITAL_COMMUNITY)
Admission: EM | Admit: 2022-11-16 | Discharge: 2022-11-16 | Disposition: A | Discharge status: Home / Self Care | Payer: Medicaid Other | Attending: Physician Assistant | Admitting: Physician Assistant

## 2022-11-16 ENCOUNTER — Telehealth (HOSPITAL_COMMUNITY): Payer: Self-pay | Admitting: Emergency Medicine

## 2022-11-16 ENCOUNTER — Encounter (HOSPITAL_COMMUNITY): Payer: Self-pay

## 2022-11-16 DIAGNOSIS — R0981 Nasal congestion: Secondary | ICD-10-CM | POA: Diagnosis not present

## 2022-11-16 DIAGNOSIS — J302 Other seasonal allergic rhinitis: Secondary | ICD-10-CM

## 2022-11-16 DIAGNOSIS — I10 Essential (primary) hypertension: Secondary | ICD-10-CM

## 2022-11-16 MED ORDER — CETIRIZINE HCL 10 MG PO TABS: 10.0000 mg | ORAL_TABLET | Freq: Every day | ORAL | 0 refills | Status: DC

## 2022-11-16 MED ORDER — LEVOCETIRIZINE DIHYDROCHLORIDE 5 MG PO TABS: 5.0000 mg | ORAL_TABLET | Freq: Every evening | ORAL | 2 refills | Status: DC

## 2022-11-16 MED ORDER — FLUTICASONE PROPIONATE 50 MCG/ACT NA SUSP: 1.0000 | Freq: Every day | NASAL | 0 refills | Status: DC

## 2022-11-16 NOTE — ED Triage Notes (Signed)
Pt is here for 1 day of nasal congestion. Pt is not taking any medication at this time.

## 2022-11-16 NOTE — ED Provider Notes (Signed)
[facility]    CSN: [id number] Arrival date & time: 11/16/22  4098      History   Chief Complaint Chief Complaint  Patient presents with   Nasal Congestion    HPI Sherri Gilbert is a 52 y.o. female.   Patient presents today with a several day history of mild URI symptoms including nasal congestion, sneezing, drainage.  Denies any fever, cough, shortness of breath, chest pain, nausea, vomiting.  She has tried Coricidin without improvement of symptoms.  She does have a history of seasonal allergies when she was younger but has not had issues with this recently until the past 1 to 2 years.  Denies any known sick contacts but she does work around the elderly.  She did take an at home COVID test that was negative when symptoms began.  Denies any recent antibiotics or steroids.  Has remote history of asthma but has not required medication for this recently.  She quit smoking many years ago.  Blood pressure is elevated today.  She does not take any antihypertensive medication.  She recently got new insurance and so would like to establish with a new primary care provider.  She denies any chest pain, shortness of breath, headache, vision change, dizziness.  She avoids decongestants and is allergic to NSAIDs.  Denies any increase in sodium consumption.    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      Home Medications    Prior to Admission medications   Medication Sig Start Date End Date Taking? Authorizing Provider   fluticasone (FLONASE) 50 MCG/ACT nasal spray Place 1 spray into both nostrils daily. 11/16/22  Yes Lameshia Hypolite, Denny Peon K, PA-C  levocetirizine (XYZAL ALLERGY 24HR) 5 MG tablet Take 1 tablet (5 mg total) by mouth every evening. 11/16/22  Yes Loyed Wilmes, Noberto Retort, PA-C    Family History Family History  Problem Relation Age of Onset   Ulcers Father    Colon cancer Neg Hx    Esophageal cancer Neg Hx    Rectal cancer Neg Hx     Social History Social History   Tobacco Use   Smoking status: Former    Packs/day: .5    Types: Cigarettes    Quit date: 06/08/2017    Years since quitting: 5.4   Smokeless tobacco: Never  Vaping Use   Vaping Use: Never used  Substance Use Topics   Alcohol use: No   Drug use: No     Allergies   Nsaids   Review of Systems Review of Systems  Constitutional:  Negative for activity change, appetite change, fatigue and fever.  HENT:  Positive for congestion, postnasal drip and sneezing. Negative for sore throat.   Eyes:  Negative for visual disturbance.  Respiratory:  Negative for cough and shortness of breath.   Cardiovascular:  Negative for chest pain.  Gastrointestinal:  Negative for abdominal pain, diarrhea, nausea and vomiting.  Neurological:  Negative for dizziness, light-headedness and headaches.     Physical  Exam Triage Vital Signs ED Triage Vitals [11/16/22 1018]  Enc Vitals Group     BP (!) 144/101     Pulse Rate 87     Resp 18     Temp 98.2 F (36.8 C)     Temp Source Oral     SpO2 99 %     Weight      Height      Head Circumference      Peak Flow      Pain Score      Pain Loc      Pain Edu?      Excl. in GC?    No data found.  Updated Vital Signs BP (!) 155/88 (BP Location: Right Arm)   Pulse 87   Temp 98.2 F (36.8 C) (Oral)   Resp 18   LMP  (LMP Unknown)   SpO2 99%   Visual Acuity Right Eye Distance:   Left Eye Distance:   Bilateral Distance:    Right Eye Near:   Left Eye Near:    Bilateral Near:     Physical  Exam Vitals reviewed.  Constitutional:      General: She is awake. She is not in acute distress.    Appearance: Normal appearance. She is well-developed. She is not ill-appearing.     Comments: Very pleasant female appears stated age in no acute distress sitting comfortably in exam room  HENT:     Head: Normocephalic and atraumatic.     Right Ear: External ear normal. There is impacted cerumen.     Left Ear: Tympanic membrane, ear canal and external ear normal. Tympanic membrane is not erythematous or bulging.     Nose: Rhinorrhea present. Rhinorrhea is clear.     Right Sinus: No maxillary sinus tenderness or frontal sinus tenderness.     Left Sinus: No maxillary sinus tenderness or frontal sinus tenderness.     Mouth/Throat:     Pharynx: Uvula midline. No oropharyngeal exudate or posterior oropharyngeal erythema.     Comments: Erythema and drainage in posterior oropharynx Cardiovascular:     Rate and Rhythm: Normal rate and regular rhythm.     Heart sounds: Normal heart sounds, S1 normal and S2 normal. No murmur heard. Pulmonary:     Effort: Pulmonary effort is normal.     Breath sounds: Normal breath sounds. No wheezing, rhonchi or rales.     Comments: Clear to auscultation bilaterally Psychiatric:        Behavior: Behavior is cooperative.      UC Treatments / Results  Labs (all labs ordered are listed, but only abnormal results are displayed) Labs Reviewed - No data to display  EKG   Radiology No results found.  Procedures Procedures (including critical care time)  Medications Ordered in UC Medications - No data to display  Initial Impression / Assessment and Plan / UC Course  I have reviewed the triage vital signs and the nursing notes.  Pertinent labs & imaging results that were available during my care of the patient were reviewed by me and considered in my medical decision making (see chart for details).     I suspect that URI symptoms are related to  seasonal allergies given clinical presentation.  She had a negative at-home COVID test and declined repeat testing.  She was started on Xyzal and fluticasone to help manage her symptoms.  Can also use nasal saline/sinus rinses.  Discussed that if she develops any worsening or changing symptoms she  should return for reevaluation.  She was provided a work excuse note.  Blood pressure is elevated today.  Patient believes is situational as she reports a lot of stress related to her son in [place].  Recommended that she monitor her blood pressure at home and keep a log for evaluation of appointment.  Will try to establish care with a primary care for PCP assistance.  Discussed that she is to avoid decongestants, caffeine, sodium, NSAIDs.  Discussed that if she has any worsening or changing symptoms including chest pain, shortness of breath, headache, vision change, dizziness in the setting of high blood pressure she needs to be seen immediately.  Strict return precautions given.  Final Clinical Impressions(s) / UC Diagnoses   Final diagnoses:  Seasonal allergies  Nasal congestion  Elevated blood pressure reading in office with diagnosis of hypertension     Discharge Instructions      I believe your symptoms are related to seasonal allergies.  Please start levocetirizine at night and use fluticasone 1 spray in each nostril daily.  You can also use nasal saline and sinus rinses.  If you have any worsening or changing symptoms including associated cough, fever, shortness of breath you should be seen immediately.  Your blood pressure is elevated.  This can be situational.  Try to monitor blood pressure at home and if this is persistently above 140/90 you should be reevaluated.  Avoid decongestants, caffeine, sodium.  Try to exercise.  If you develop any chest pain, shortness of breath, headache, vision change, dizziness in the setting of high blood pressure you need to be seen immediately.  Someone  should contact you to schedule appoint with primary care.    ED Prescriptions     Medication Sig Dispense Auth. Provider   levocetirizine (XYZAL ALLERGY 24HR) 5 MG tablet Take 1 tablet (5 mg total) by mouth every evening. 30 tablet Ercel Normoyle K, PA-C   fluticasone (FLONASE) 50 MCG/ACT nasal spray Place 1 spray into both nostrils daily. 16 g Journii Nierman K, PA-C      PDMP not reviewed this encounter.   Jeani Hawking, PA-C 11/16/22 1042

## 2022-11-16 NOTE — Telephone Encounter (Signed)
Received message from front desk that patient states her Xyzal prescription is too expensive and is requesting an alternative.   Prescription sent, per Denny Peon, APP

## 2022-11-16 NOTE — Discharge Instructions (Signed)
I believe your symptoms are related to seasonal allergies.  Please start levocetirizine at night and use fluticasone 1 spray in each nostril daily.  You can also use nasal saline and sinus rinses.  If you have any worsening or changing symptoms including associated cough, fever, shortness of breath you should be seen immediately.  Your blood pressure is elevated.  This can be situational.  Try to monitor blood pressure at home and if this is persistently above 140/90 you should be reevaluated.  Avoid decongestants, caffeine, sodium.  Try to exercise.  If you develop any chest pain, shortness of breath, headache, vision change, dizziness in the setting of high blood pressure you need to be seen immediately.  Someone should contact you to schedule appoint with primary care.

## 2023-06-15 ENCOUNTER — Ambulatory Visit (HOSPITAL_COMMUNITY)
Admission: EM | Admit: 2023-06-15 | Discharge: 2023-06-15 | Disposition: A | Discharge status: Home / Self Care | Payer: Medicaid Other | Attending: Emergency Medicine | Admitting: Emergency Medicine

## 2023-06-15 ENCOUNTER — Encounter (HOSPITAL_COMMUNITY): Payer: Self-pay

## 2023-06-15 DIAGNOSIS — R030 Elevated blood-pressure reading, without diagnosis of hypertension: Secondary | ICD-10-CM

## 2023-06-15 DIAGNOSIS — J111 Influenza due to unidentified influenza virus with other respiratory manifestations: Secondary | ICD-10-CM

## 2023-06-15 NOTE — ED Triage Notes (Signed)
 Patient c/o generalized body aches, nasal congestin and a non productive cough x 2 days.  Patient states she has been taking Delsym and Tylenol for her symptoms.

## 2023-06-15 NOTE — Discharge Instructions (Addendum)
 May take Coricidin HBP for symptom management.  May take Tylenol  for fever or discomfort as label directed.  Follow-up with PCP of your choice: Please get established patient blood pressure was elevated today's visit and most likely you probably will need blood pressure management.  Return as needed

## 2023-06-15 NOTE — ED Provider Notes (Signed)
 [facility]    CSN: [id number] Arrival date & time: 06/15/23  0801      History   Chief Complaint Chief Complaint  Patient presents with   Cough   Generalized Body Aches   Nasal Congestion    HPI Samyuktha Benda is a 53 y.o. female.   53 year old female, Bentlee Hlavac, presents to urgent care for evaluation of body aches,cough, nasal congestion x 2 days, pt reports recent exposure to similar illness by fiance. Pt states she has been taking Delsym and tylenol  for her symptoms, does not take BP meds for HTN, no PCP.   PMH of asthma and HTN,peptic ulcer perforation  Offered URI testing, pt declined,requested abx.  The history is provided by the patient. No language interpreter was used.  Cough Associated symptoms: myalgias   Associated symptoms: no fever and no wheezing     Past Medical History:  Diagnosis Date   Asthma    Hypertension    Peptic ulcer     Patient Active Problem List   Diagnosis Date Noted   Influenza-like illness 06/15/2023   Elevated blood pressure reading 06/15/2023   Perforated gastric ulcer s/p omental Arlyss patch 07/10/2017 07/10/2017    Past Surgical History:  Procedure Laterality Date   LAPAROTOMY N/A 07/10/2017   Procedure: EXPLORATORY LAPAROTOMY REPAIR OF PERFORATED ULCER AT [facility];  Surgeon: Belinda Cough, MD;  Location: [facility];  Service: General;  Laterality: N/A;    OB History   No obstetric history on file.      Home Medications    Prior to Admission medications   Medication Sig Start Date End Date Taking? Authorizing Provider  cetirizine  (ZYRTEC  ALLERGY) 10 MG tablet Take 1 tablet (10 mg total) by mouth at bedtime. 11/16/22   Raspet, Erin K, PA-C  fluticasone  (FLONASE ) 50 MCG/ACT nasal spray Place 1 spray into both nostrils daily. 11/16/22   Raspet, Erin K, PA-C  levocetirizine (XYZAL  ALLERGY 24HR) 5 MG tablet Take 1 tablet (5 mg total) by mouth every evening. 11/16/22   Raspet, Rocky POUR, PA-C    Family History Family  History  Problem Relation Age of Onset   Ulcers Father    Colon cancer Neg Hx    Esophageal cancer Neg Hx    Rectal cancer Neg Hx     Social History Social History   Tobacco Use   Smoking status: Former    Current packs/day: 0.00    Types: Cigarettes    Quit date: 06/08/2017    Years since quitting: 6.0   Smokeless tobacco: Never  Vaping Use   Vaping status: Never Used  Substance Use Topics   Alcohol use: No   Drug use: No     Allergies   Nsaids   Review of Systems Review of Systems  Constitutional:  Negative for fever.  HENT:  Positive for congestion.   Respiratory:  Positive for cough. Negative for wheezing.   Musculoskeletal:  Positive for myalgias.  All other systems reviewed and are negative.    Physical Exam Triage Vital Signs ED Triage Vitals  Encounter Vitals Group     BP      Systolic BP Percentile      Diastolic BP Percentile      Pulse      Resp      Temp      Temp src      SpO2      Weight      Height  Head Circumference      Peak Flow      Pain Score      Pain Loc      Pain Education      Exclude from Growth Chart    No data found.  Updated Vital Signs BP (!) 170/87 (BP Location: Right Arm)   Pulse 99   Temp 99.3 F (37.4 C) (Oral)   Resp 14   LMP  (LMP Unknown)   SpO2 95%   Visual Acuity Right Eye Distance:   Left Eye Distance:   Bilateral Distance:    Right Eye Near:   Left Eye Near:    Bilateral Near:     Physical Exam Vitals and nursing note reviewed.  Constitutional:      General: She is not in acute distress.    Appearance: She is well-developed.  HENT:     Head: Normocephalic.     Right Ear: Tympanic membrane is retracted.     Left Ear: Tympanic membrane is retracted.     Nose: Mucosal edema and congestion present.     Mouth/Throat:     Lips: Pink.     Mouth: Mucous membranes are moist.     Pharynx: Oropharynx is clear.  Eyes:     General: Lids are normal.     Conjunctiva/sclera: Conjunctivae  normal.     Pupils: Pupils are equal, round, and reactive to light.  Neck:     Trachea: No tracheal deviation.  Cardiovascular:     Rate and Rhythm: Regular rhythm.     Pulses: Normal pulses.     Heart sounds: Normal heart sounds. No murmur heard. Pulmonary:     Effort: Pulmonary effort is normal.     Breath sounds: Normal breath sounds.  Abdominal:     General: Bowel sounds are normal.     Palpations: Abdomen is soft.     Tenderness: There is no abdominal tenderness.  Musculoskeletal:        General: Normal range of motion.     Cervical back: Normal range of motion.  Lymphadenopathy:     Cervical: No cervical adenopathy.  Skin:    General: Skin is warm and dry.     Findings: No rash.  Neurological:     General: No focal deficit present.     Mental Status: She is alert and oriented to person, place, and time.     GCS: GCS eye subscore is 4. GCS verbal subscore is 5. GCS motor subscore is 6.  Psychiatric:        Speech: Speech normal.        Behavior: Behavior normal. Behavior is cooperative.      UC Treatments / Results  Labs (all labs ordered are listed, but only abnormal results are displayed) Labs Reviewed - No data to display  EKG   Radiology No results found.  Procedures Procedures (including critical care time)  Medications Ordered in UC Medications - No data to display  Initial Impression / Assessment and Plan / UC Course  I have reviewed the triage vital signs and the nursing notes.  Pertinent labs & imaging results that were available during my care of the patient were reviewed by me and considered in my medical decision making (see chart for details).    Disussed exam findings and plan of care with patient, no antibiotics indicated at present, patient verbalized understanding to this provider, URI testing(COVID flu RSV offered), patient declined, recommend Coricidin HBP as patient has elevated blood  pressure and is on no hypertension management,  patient referred to PCP,  requesting work note, given.  Ddx: Viral URI with cough, allergies Final Clinical Impressions(s) / UC Diagnoses   Final diagnoses:  Influenza-like illness  Elevated blood pressure reading     Discharge Instructions      May take Coricidin HBP for symptom management.  May take Tylenol  for fever or discomfort as label directed.  Follow-up with PCP of your choice: Please get established patient blood pressure was elevated today's visit and most likely you probably will need blood pressure management.  Return as needed     ED Prescriptions   None    PDMP not reviewed this encounter.   Aminta Loose, NP 06/15/23 [phone]

## 2023-06-20 ENCOUNTER — Encounter (HOSPITAL_COMMUNITY): Payer: Self-pay

## 2023-06-20 ENCOUNTER — Ambulatory Visit (HOSPITAL_COMMUNITY)
Admission: EM | Admit: 2023-06-20 | Discharge: 2023-06-20 | Disposition: A | Discharge status: Home / Self Care | Payer: Medicaid Other | Attending: Emergency Medicine | Admitting: Emergency Medicine

## 2023-06-20 DIAGNOSIS — R030 Elevated blood-pressure reading, without diagnosis of hypertension: Secondary | ICD-10-CM | POA: Diagnosis not present

## 2023-06-20 DIAGNOSIS — F419 Anxiety disorder, unspecified: Secondary | ICD-10-CM

## 2023-06-20 DIAGNOSIS — G479 Sleep disorder, unspecified: Secondary | ICD-10-CM

## 2023-06-20 MED ORDER — HYDROXYZINE HCL 25 MG PO TABS: 25.0000 mg | ORAL_TABLET | Freq: Four times a day (QID) | ORAL | 0 refills | Status: DC | PRN

## 2023-06-20 NOTE — Discharge Instructions (Addendum)
 You can try taking the hydroxyzine  up to 3x daily (every 6 hours) This can help treat anxiety and may also make you sleepy  Please follow up with the behavioral health urgent care I have also scheduled you with a primary care provider - details below. Monday January 27th

## 2023-06-20 NOTE — ED Provider Notes (Signed)
 [facility]    CSN: [id number] Arrival date & time: 06/20/23  1134     History    HPI Sherri Gilbert is a 53 y.o. female.  Presents with frequent worrying.  Patient reports even small things will start her thinking and worrying. Her son is in [place] where the fires are happening. She has not been sleeping at night. Reports sleeping for only 2 hours at a time.  She was sick last week with flu-like illness Reports those symptoms are resolving. Her body just feels tired  She believes she has anxiety. Has not been evaluated for it in the past. Also reports a family history of high blood pressure. The last few months her pressures have been high at visits. She does not currently have a PCP. She denies chest pain or tightness, shortness of breath or wheezing, headache, dizziness, vision changes.   Past Medical History:  Diagnosis Date   Asthma    Hypertension    Peptic ulcer     Patient Active Problem List   Diagnosis Date Noted   Influenza-like illness 06/15/2023   Elevated blood pressure reading 06/15/2023   Perforated gastric ulcer s/p omental Arlyss patch 07/10/2017 07/10/2017    Past Surgical History:  Procedure Laterality Date   LAPAROTOMY N/A 07/10/2017   Procedure: EXPLORATORY LAPAROTOMY REPAIR OF PERFORATED ULCER AT [facility];  Surgeon: Belinda Cough, MD;  Location: [facility];  Service: General;  Laterality: N/A;    OB History   No obstetric history on file.      Home Medications    Prior to Admission medications   Medication Sig Start Date End Date Taking? Authorizing Provider  hydrOXYzine  (ATARAX ) 25 MG tablet Take 1 tablet (25 mg total) by mouth every 6 (six) hours as needed. 06/20/23  Yes Jadarrius Maselli, [place], PA-C    Family History Family History  Problem Relation Age of Onset   Ulcers Father    Colon cancer Neg Hx    Esophageal cancer Neg Hx    Rectal cancer Neg Hx     Social History Social History   Tobacco Use   Smoking status: Former    Current  packs/day: 0.00    Types: Cigarettes    Quit date: 06/08/2017    Years since quitting: 6.0   Smokeless tobacco: Never  Vaping Use   Vaping status: Never Used  Substance Use Topics   Alcohol use: No   Drug use: No     Allergies   Nsaids   Review of Systems Review of Systems Per HPI  Physical Exam Triage Vital Signs ED Triage Vitals  Encounter Vitals Group     BP 06/20/23 1234 (!) 160/89     Systolic BP Percentile --      Diastolic BP Percentile --      Pulse Rate 06/20/23 1234 (!) 148     Resp 06/20/23 1234 16     Temp 06/20/23 1234 98.7 F (37.1 C)     Temp Source 06/20/23 1234 Oral     SpO2 06/20/23 1234 97 %     Weight --      Height --      Head Circumference --      Peak Flow --      Pain Score 06/20/23 1236 0     Pain Loc --      Pain Education --      Exclude from Growth Chart --    No data found.  Updated Vital Signs BP (!) 156/101 (  BP Location: Right Arm)   Pulse (!) 110   Temp 99.9 F (37.7 C)   Resp 16   LMP  (LMP Unknown)   SpO2 97%   Physical Exam Vitals and nursing note reviewed.  Constitutional:      General: She is not in acute distress.    Appearance: Normal appearance. She is not ill-appearing or diaphoretic.  HENT:     Mouth/Throat:     Mouth: Mucous membranes are moist.     Pharynx: Oropharynx is clear.  Cardiovascular:     Rate and Rhythm: Normal rate and regular rhythm.     Pulses: Normal pulses.     Heart sounds: Normal heart sounds.     Comments: HR normal on auscultation and radial palpation. Monitor reading 105-110 Pulmonary:     Effort: Pulmonary effort is normal.     Breath sounds: Normal breath sounds.  Skin:    General: Skin is warm and dry.  Neurological:     Mental Status: She is alert and oriented to person, place, and time.  Psychiatric:        Mood and Affect: Affect is tearful.     UC Treatments / Results  Labs (all labs ordered are listed, but only abnormal results are displayed) Labs Reviewed - No  data to display  EKG  Radiology No results found.  Procedures Procedures (including critical care time)  Medications Ordered in UC Medications - No data to display  Initial Impression / Assessment and Plan / UC Course  I have reviewed the triage vital signs and the nursing notes.  Pertinent labs & imaging results that were available during my care of the patient were reviewed by me and considered in my medical decision making (see chart for details).  Patient with frequent worrying, believes has anxiety, and recent issues with sleeping. Recommend Atarax , can try TID. She does report other antihistamines make her drowsy, so hopefully this will help with sleeping. I do recommend [facility] with [facility]. BP is elevated 156/101. Unclear if this is related to anxiety or an underlying cause. Will defer starting medication for BP at this time. I have scheduled her with a PCP for 2 weeks from today. At follow up can have BP checked, evaluated Atarax  efficiency, and PCP can determine if HTN meds are indicated.  She has no red flags at this time Discussed return precautions A work note is requested and provided  Final Clinical Impressions(s) / UC Diagnoses   Final diagnoses:  Anxiety  Sleep disturbance  Elevated BP without diagnosis of hypertension     Discharge Instructions      You can try taking the hydroxyzine  up to 3x daily (every 6 hours) This can help treat anxiety and may also make you sleepy  Please follow up with the behavioral health urgent care I have also scheduled you with a primary care provider - details below. Monday January 27th     ED Prescriptions     Medication Sig Dispense Auth. Provider   hydrOXYzine  (ATARAX ) 25 MG tablet Take 1 tablet (25 mg total) by mouth every 6 (six) hours as needed. 45 tablet Vitor Overbaugh, [place], PA-C      PDMP not reviewed this encounter.   Jeryl Asberry, [place] 06/20/23 1410

## 2023-06-20 NOTE — ED Triage Notes (Signed)
 Pt reports she has high blood pressure and states she worries a lot. States she is having trouble sleeping x 3 days .

## 2023-07-05 ENCOUNTER — Encounter: Payer: Medicaid Other | Admitting: Family

## 2023-07-05 NOTE — Progress Notes (Signed)
This encounter was created in error - please disregard. No show

## 2023-11-05 ENCOUNTER — Encounter (HOSPITAL_COMMUNITY): Payer: Self-pay

## 2023-11-05 ENCOUNTER — Ambulatory Visit (HOSPITAL_COMMUNITY)
Admission: EM | Admit: 2023-11-05 | Discharge: 2023-11-05 | Disposition: A | Discharge status: Home / Self Care | Attending: Emergency Medicine | Admitting: Emergency Medicine

## 2023-11-05 ENCOUNTER — Telehealth (HOSPITAL_COMMUNITY): Payer: Self-pay | Admitting: *Deleted

## 2023-11-05 DIAGNOSIS — K047 Periapical abscess without sinus: Secondary | ICD-10-CM | POA: Diagnosis not present

## 2023-11-05 MED ORDER — NYSTATIN 100000 UNIT/ML MT SUSP: 5.0000 mL | Freq: Four times a day (QID) | OROMUCOSAL | 0 refills | Status: AC | PRN

## 2023-11-05 MED ORDER — NYSTATIN 100000 UNIT/ML MT SUSP: 5.0000 mL | Freq: Four times a day (QID) | OROMUCOSAL | 0 refills | Status: DC | PRN

## 2023-11-05 MED ORDER — HYDROCODONE-ACETAMINOPHEN 5-325 MG PO TABS: 2.0000 | ORAL_TABLET | ORAL | 0 refills | Status: AC | PRN

## 2023-11-05 MED ORDER — AMOXICILLIN-POT CLAVULANATE 875-125 MG PO TABS: 1.0000 | ORAL_TABLET | Freq: Two times a day (BID) | ORAL | 0 refills | Status: AC

## 2023-11-05 NOTE — Discharge Instructions (Addendum)
 Take the Augmentin  twice daily with food to help with your dental infection.  Use the mouthwash 3 times daily.  You can use the Norco every 4 hours as needed, this is a narcotic so be aware that this may cause drowsiness, sedation and constipation.  Do not drink alcohol or drive on this medication.  Follow-up with your dentist as prescribed.  Return to clinic for new or urgent symptoms.

## 2023-11-05 NOTE — ED Triage Notes (Signed)
 Patient presenting with dental pain and swelling on the right lower side onset 2 days ago. Patient states she has an infected tooth that needs to come out.   Prescriptions or OTC medications tried: Yes- tylenol     with mild relief

## 2023-11-05 NOTE — Telephone Encounter (Signed)
 Called pharm pt states that she can't get magic mouth wash. Pharm states lidocaine  is backordered and suggested send to a non [facility] pharmacy. Pt aware and rx sent to [facility] [address]

## 2023-11-05 NOTE — ED Provider Notes (Signed)
 [facility]    CSN: [id number] Arrival date & time: 11/05/23  0803      History   Chief Complaint Chief Complaint  Patient presents with   Dental Problem    HPI Sherri Gilbert is a 53 y.o. female.   Patient presents to clinic over concern of right sided lower dental infection.  2 days ago she noticed some right lower jaw swelling.  She does have bad teeth, reports a dentist is in the process of removing all of her teeth and she will get fitted for dentures afterwards.  She has an infected tooth and has had some drainage at the site.  Is scheduled with a dentist next week for removal.  Has had Tylenol  with mild relief.  Cannot take NSAIDs.  The history is provided by the patient and medical records.    Past Medical History:  Diagnosis Date   Asthma    Hypertension    Peptic ulcer     Patient Active Problem List   Diagnosis Date Noted   Influenza-like illness 06/15/2023   Elevated blood pressure reading 06/15/2023   Perforated gastric ulcer s/p omental Tyrone Gallop patch 07/10/2017 07/10/2017    Past Surgical History:  Procedure Laterality Date   LAPAROTOMY N/A 07/10/2017   Procedure: EXPLORATORY LAPAROTOMY REPAIR OF PERFORATED ULCER AT [facility];  Surgeon: Dareen Ebbing, MD;  Location: [facility];  Service: General;  Laterality: N/A;    OB History   No obstetric history on file.      Home Medications    Prior to Admission medications   Medication Sig Start Date End Date Taking? Authorizing Provider  amoxicillin -clavulanate (AUGMENTIN ) 875-125 MG tablet Take 1 tablet by mouth every 12 (twelve) hours. 11/05/23  Yes Jaylon Grode  N, FNP  HYDROcodone -acetaminophen  (NORCO/VICODIN) 5-325 MG tablet Take 2 tablets by mouth every 4 (four) hours as needed. 11/05/23  Yes Giovanna Kemmerer  N, FNP  hydrOXYzine  (ATARAX ) 25 MG tablet Take 1 tablet (25 mg total) by mouth every 6 (six) hours as needed. 06/20/23  Yes Rising, Ivette Marks, PA-C  magic mouthwash (nystatin, lidocaine ,  diphenhydrAMINE ) suspension Take 5 mLs by mouth 4 (four) times daily as needed for up to 10 days for mouth pain. 11/05/23 11/15/23 Yes Shawnelle Spoerl  N, FNP    Family History Family History  Problem Relation Age of Onset   Ulcers Father    Colon cancer Neg Hx    Esophageal cancer Neg Hx    Rectal cancer Neg Hx     Social History Social History   Tobacco Use   Smoking status: Former    Current packs/day: 0.00    Types: Cigarettes    Quit date: 06/08/2017    Years since quitting: 6.4   Smokeless tobacco: Never  Vaping Use   Vaping status: Never Used  Substance Use Topics   Alcohol use: No   Drug use: No     Allergies   Nsaids   Review of Systems Review of Systems  Per HPI  Physical Exam Triage Vital Signs ED Triage Vitals  Encounter Vitals Group     BP 11/05/23 0824 (!) 158/89     Systolic BP Percentile --      Diastolic BP Percentile --      Pulse Rate 11/05/23 0824 (!) 128     Resp 11/05/23 0824 16     Temp 11/05/23 0824 98.6 F (37 C)     Temp Source 11/05/23 0824 Oral     SpO2 11/05/23 0824 93 %  Weight --      Height --      Head Circumference --      Peak Flow --      Pain Score 11/05/23 0823 9     Pain Loc --      Pain Education --      Exclude from Growth Chart --    No data found.  Updated Vital Signs BP (!) 158/89 (BP Location: Left Arm)   Pulse (!) 128   Temp 98.6 F (37 C) (Oral)   Resp 16   LMP  (LMP Unknown)   SpO2 93%   Visual Acuity Right Eye Distance:   Left Eye Distance:   Bilateral Distance:    Right Eye Near:   Left Eye Near:    Bilateral Near:     Physical Exam Vitals and nursing note reviewed.  Constitutional:      Appearance: Normal appearance.  HENT:     Head: Normocephalic and atraumatic.     Right Ear: External ear normal.     Left Ear: External ear normal.     Nose: Nose normal.     Mouth/Throat:     Mouth: Mucous membranes are moist.   Eyes:     Conjunctiva/sclera: Conjunctivae normal.   Cardiovascular:     Rate and Rhythm: Normal rate.  Pulmonary:     Effort: Pulmonary effort is normal. No respiratory distress.  Neurological:     General: No focal deficit present.     Mental Status: She is alert.  Psychiatric:        Behavior: Behavior is cooperative.      UC Treatments / Results  Labs (all labs ordered are listed, but only abnormal results are displayed) Labs Reviewed - No data to display  EKG   Radiology No results found.  Procedures Procedures (including critical care time)  Medications Ordered in UC Medications - No data to display  Initial Impression / Assessment and Plan / UC Course  I have reviewed the triage vital signs and the nursing notes.  Pertinent labs & imaging results that were available during my care of the patient were reviewed by me and considered in my medical decision making (see chart for details).  Vitals in triage reviewed, patient is hemodynamically stable.  Right lower jaw swelling and dental infection present with purulent drainage.  Will treat with Augmentin  and Magic mouthwash.  Norco as needed for severe pain due to NSAID allergy.  Dental follow-up encouraged.  Low concern for systemic illness, afebrile, she is tachycardic.  Plan of care, follow-up care return precautions given, no questions at this time.     Final Clinical Impressions(s) / UC Diagnoses   Final diagnoses:  Dental infection     Discharge Instructions      Take the Augmentin  twice daily with food to help with your dental infection.  Use the mouthwash 3 times daily.  You can use the Norco every 4 hours as needed, this is a narcotic so be aware that this may cause drowsiness, sedation and constipation.  Do not drink alcohol or drive on this medication.  Follow-up with your dentist as prescribed.  Return to clinic for new or urgent symptoms.  ED Prescriptions     Medication Sig Dispense Auth. Provider   amoxicillin -clavulanate (AUGMENTIN ) 875-125  MG tablet Take 1 tablet by mouth every 12 (twelve) hours. 14 tablet Harlow Lighter, Elbie Statzer  N, FNP   HYDROcodone -acetaminophen  (NORCO/VICODIN) 5-325 MG tablet Take 2 tablets by mouth every 4 (four)  hours as needed. 15 tablet Harlow Lighter, Timiyah Romito  N, FNP   magic mouthwash (nystatin, lidocaine , diphenhydrAMINE ) suspension Take 5 mLs by mouth 4 (four) times daily as needed for up to 10 days for mouth pain. 180 mL Harlow Lighter, Tajae Maiolo  N, FNP      I have reviewed the PDMP during this encounter.   Harlow Lighter, Cosandra Plouffe  N, [place] 11/05/23 [id number]

## 2024-03-16 ENCOUNTER — Ambulatory Visit (HOSPITAL_COMMUNITY): Admission: EM | Admit: 2024-03-16 | Discharge: 2024-03-16 | Disposition: A | Discharge status: Home / Self Care

## 2024-03-16 ENCOUNTER — Other Ambulatory Visit: Payer: Self-pay

## 2024-03-16 ENCOUNTER — Encounter (HOSPITAL_COMMUNITY): Payer: Self-pay | Admitting: *Deleted

## 2024-03-16 DIAGNOSIS — J069 Acute upper respiratory infection, unspecified: Secondary | ICD-10-CM

## 2024-03-16 DIAGNOSIS — R070 Pain in throat: Secondary | ICD-10-CM | POA: Diagnosis not present

## 2024-03-16 DIAGNOSIS — F411 Generalized anxiety disorder: Secondary | ICD-10-CM

## 2024-03-16 LAB — POCT RAPID STREP A (OFFICE): Rapid Strep A Screen: NEGATIVE

## 2024-03-16 MED ORDER — HYDROXYZINE HCL 25 MG PO TABS: 25.0000 mg | ORAL_TABLET | Freq: Four times a day (QID) | ORAL | 0 refills | Status: AC | PRN

## 2024-03-16 NOTE — ED Provider Notes (Signed)
 [facility]    CSN: [id number] Arrival date & time: 03/16/24  1657      History   Chief Complaint Chief Complaint  Patient presents with   Sore Throat   Generalized Body Aches    HPI Sherri Gilbert is a 53 y.o. female.   Patient presents today due to sinus pressure and nasal congestion that started on Monday.  Patient states she has throat pain as well as dry cough.  Patient states that she took OTC cold medications today with no significant relief.  Patient states that she went to a gathering over the weekend and believes she may have become sick there.  Patient denies fever, chills, nausea, vomiting, or change in appetite.  Pt also states that she would like a refill on Hydroxyzine  for anxiety.    Sore Throat    Past Medical History:  Diagnosis Date   Asthma    Hypertension    Peptic ulcer     Patient Active Problem List   Diagnosis Date Noted   Influenza-like illness 06/15/2023   Elevated blood pressure reading 06/15/2023   Perforated gastric ulcer s/p omental Arlyss patch 07/10/2017 07/10/2017    Past Surgical History:  Procedure Laterality Date   LAPAROTOMY N/A 07/10/2017   Procedure: EXPLORATORY LAPAROTOMY REPAIR OF PERFORATED ULCER AT [facility];  Surgeon: Belinda Cough, MD;  Location: [facility];  Service: General;  Laterality: N/A;    OB History   No obstetric history on file.      Home Medications    Prior to Admission medications   Medication Sig Start Date End Date Taking? Authorizing Provider  amoxicillin -clavulanate (AUGMENTIN ) 875-125 MG tablet Take 1 tablet by mouth every 12 (twelve) hours. 11/05/23   Dreama, [place]  N, FNP  HYDROcodone -acetaminophen  (NORCO/VICODIN) 5-325 MG tablet Take 2 tablets by mouth every 4 (four) hours as needed. 11/05/23   Dreama, [place]  N, FNP  hydrOXYzine  (ATARAX ) 25 MG tablet Take 1 tablet (25 mg total) by mouth every 6 (six) hours as needed. 03/16/24   Andra Corean BROCKS, PA-C    Family History Family  History  Problem Relation Age of Onset   Ulcers Father    Colon cancer Neg Hx    Esophageal cancer Neg Hx    Rectal cancer Neg Hx     Social History Social History   Tobacco Use   Smoking status: Former    Current packs/day: 0.00    Types: Cigarettes    Quit date: 06/08/2017    Years since quitting: 6.7   Smokeless tobacco: Never  Vaping Use   Vaping status: Never Used  Substance Use Topics   Alcohol use: No   Drug use: No     Allergies   Nsaids   Review of Systems Review of Systems   Physical Exam Triage Vital Signs ED Triage Vitals  Encounter Vitals Group     BP 03/16/24 1721 (!) 166/101     Girls Systolic BP Percentile --      Girls Diastolic BP Percentile --      Boys Systolic BP Percentile --      Boys Diastolic BP Percentile --      Pulse Rate 03/16/24 1721 (!) 132     Resp 03/16/24 1721 18     Temp 03/16/24 1721 98.4 F (36.9 C)     Temp src --      SpO2 03/16/24 1721 96 %     Weight --      Height --  Head Circumference --      Peak Flow --      Pain Score 03/16/24 1718 9     Pain Loc --      Pain Education --      Exclude from Growth Chart --    No data found.  Updated Vital Signs BP (!) 166/101   Pulse (!) 132   Temp 98.4 F (36.9 C)   Resp 18   LMP  (LMP Unknown)   SpO2 96%   Visual Acuity Right Eye Distance:   Left Eye Distance:   Bilateral Distance:    Right Eye Near:   Left Eye Near:    Bilateral Near:     Physical Exam Vitals and nursing note reviewed.  Constitutional:      General: She is not in acute distress.    Appearance: Normal appearance. She is not ill-appearing, toxic-appearing or diaphoretic.  HENT:     Nose: Congestion (mildly enlarged turbinates) present. No rhinorrhea.     Mouth/Throat:     Mouth: Mucous membranes are moist.     Pharynx: Oropharyngeal exudate present. No posterior oropharyngeal erythema.  Eyes:     General: No scleral icterus. Cardiovascular:     Rate and Rhythm: Regular rhythm.  Tachycardia present.     Heart sounds: Normal heart sounds.  Pulmonary:     Effort: Pulmonary effort is normal. No respiratory distress.     Breath sounds: Normal breath sounds. No wheezing or rhonchi.  Skin:    General: Skin is warm.  Neurological:     Mental Status: She is alert and oriented to person, place, and time.  Psychiatric:        Mood and Affect: Mood normal.        Behavior: Behavior normal.      UC Treatments / Results  Labs (all labs ordered are listed, but only abnormal results are displayed) Labs Reviewed  POCT RAPID STREP A (OFFICE)    EKG   Radiology No results found.  Procedures Procedures (including critical care time)  Medications Ordered in UC Medications - No data to display  Initial Impression / Assessment and Plan / UC Course  I have reviewed the triage vital signs and the nursing notes.  Pertinent labs & imaging results that were available during my care of the patient were reviewed by me and considered in my medical decision making (see chart for details).     Viral URI-strep test is negative.  Physical exam is unremarkable, symptoms most likely due to viral URI.  Anxiety-patient was given refills of Vistaril . Final Clinical Impressions(s) / UC Diagnoses   Final diagnoses:  Throat pain  Viral URI  Generalized anxiety disorder     Discharge Instructions      You been diagnosed with a viral illness today. -Viruses have to run their course and medicines that are prescribed are meant to help with symptoms. - With viruses usually feel poorly from 3 to 7 days with cough being the last symptoms to resolve.  -Cough can linger from days to weeks.  Antibiotics are not effective for viruses. -If your cough lasts more than 2 weeks and you are coughing so hard that you are vomiting or feel like you could pass out we need to follow-up with PCP for further testing and evaluation. -Rest, increase water intake, may use pseudoephedrine for nasal  congestion, Delsym (dextromethorphan) or honey as needed for cough, and ibuprofen and/or Tylenol  as directed on packaging for pain and fever. -  If you have hypertension you should take Coricidin or other OTC meds approved for people with high blood pressure. -You may use a spoonful of honey every 4-6 hours as needed for throat pain and cough. -Warm tea with honey and lemon are helpful for soothe throat as well.  Chloraseptic and Cepacol make a throat lozenge with numbing medication, can be purchased over-the-counter. -May also use Flonase  or sinus rinse for sinus pressure or nasal congestion.  Be sure to use distilled bottled water for sinus rinses. -May use coolmist humidifier to open up nasal passages -May elevate head to assist with postnasal drainage. -If you feel poorly (fever, fatigue, shortness of breath, nausea, etc.) for more than 10 days to be sure to follow-up with PCP or in clinic for further evaluation and additional treatments. If you experience chest pain with shortness of breath or pulse oxygen less than 95% you should report to the ER.      ED Prescriptions     Medication Sig Dispense Auth. Provider   hydrOXYzine  (ATARAX ) 25 MG tablet Take 1 tablet (25 mg total) by mouth every 6 (six) hours as needed. 45 tablet Andra Corean BROCKS, PA-C      PDMP not reviewed this encounter.   Andra Corean BROCKS, PA-C 03/16/24 1816

## 2024-03-16 NOTE — ED Triage Notes (Addendum)
 PT reports Late Monday she developed a sore throat and general body aches. PT reports she develops anxiety when sick. The last UC visit the provider gave her med for anxiety. Pt needs refill.

## 2024-03-16 NOTE — Discharge Instructions (Addendum)
